# Patient Record
Sex: Male | Born: 1965 | Race: Black or African American | Hispanic: No | Marital: Married | Smoking: Former smoker
Health system: Southern US, Community
[De-identification: ages and names within clinical notes are randomized; demographics above are authoritative.]

## PROBLEM LIST (undated history)

## (undated) DIAGNOSIS — F1910 Other psychoactive substance abuse, uncomplicated: Secondary | ICD-10-CM

## (undated) DIAGNOSIS — F329 Major depressive disorder, single episode, unspecified: Secondary | ICD-10-CM

## (undated) DIAGNOSIS — J209 Acute bronchitis, unspecified: Secondary | ICD-10-CM

## (undated) DIAGNOSIS — F32A Depression, unspecified: Secondary | ICD-10-CM

## (undated) HISTORY — DX: Other psychoactive substance abuse, uncomplicated: F19.10

## (undated) HISTORY — DX: Depression, unspecified: F32.A

## (undated) HISTORY — DX: Acute bronchitis, unspecified: J20.9

## (undated) HISTORY — PX: TOOTH EXTRACTION: SUR596

## (undated) HISTORY — PX: CYST EXCISION: SHX5701

## (undated) HISTORY — DX: Major depressive disorder, single episode, unspecified: F32.9

---

## 1988-07-09 DIAGNOSIS — F1910 Other psychoactive substance abuse, uncomplicated: Secondary | ICD-10-CM

## 1988-07-09 HISTORY — DX: Other psychoactive substance abuse, uncomplicated: F19.10

## 2012-07-09 HISTORY — PX: MOUTH SURGERY: SHX715

## 2014-04-07 ENCOUNTER — Ambulatory Visit (INDEPENDENT_AMBULATORY_CARE_PROVIDER_SITE_OTHER): Payer: BC Managed Care – PPO | Admitting: Family Medicine

## 2014-04-07 ENCOUNTER — Encounter: Payer: Self-pay | Admitting: Family Medicine

## 2014-04-07 VITALS — BP 114/76 | HR 67 | Temp 98.3°F | Ht 68.0 in | Wt 184.5 lb

## 2014-04-07 DIAGNOSIS — F419 Anxiety disorder, unspecified: Principal | ICD-10-CM

## 2014-04-07 DIAGNOSIS — F341 Dysthymic disorder: Secondary | ICD-10-CM

## 2014-04-07 DIAGNOSIS — F32A Depression, unspecified: Secondary | ICD-10-CM

## 2014-04-07 DIAGNOSIS — F329 Major depressive disorder, single episode, unspecified: Secondary | ICD-10-CM | POA: Insufficient documentation

## 2014-04-07 MED ORDER — CITALOPRAM HYDROBROMIDE 20 MG PO TABS: 20.0000 mg | ORAL_TABLET | Freq: Every day | ORAL | Status: DC

## 2014-04-07 NOTE — Progress Notes (Signed)
Pre visit review using our clinic review tool, if applicable. No additional management support is needed unless otherwise documented below in the visit note. 

## 2014-04-07 NOTE — Assessment & Plan Note (Signed)
>  30 minutes spent in face to face time with patient, >50% spent in counselling or coordination of care. I agrees to psychotherapy- referral placed. Also he wants to try a different antidepressant- I did suggest trying to take zoloft at night first.  Ran out of zoloft weeks ago and feels much less tired so he does not want to restart it. eRx sent for celexa- discussed possible interaction with trazodone- he will try benadryl/melatonin for insomnia. Follow up in 1 month. Advised to go to ER for any suicidal or homicidal thoughts. The patient indicates understanding of these issues and agrees with the plan.

## 2014-04-07 NOTE — Patient Instructions (Signed)
It was nice to meet you. We are starting citalopram 20 mg daily. We are also referring a therapist- please stop by to see Shirlee LimerickMarion on your way out to set this up.  Try taking benadryl and or melatonin, and use the trazodone less.  Come see me in 1 month for a complete physical.

## 2014-04-07 NOTE — Progress Notes (Signed)
Subjective:   Patient ID: John Walls, male    DOB: 05/19/1966, 48 y.o.   MRN: [id number]  John Walls is a pleasant 48 y.o. 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  Very stressful job.  On the phone all day- recently started to work from home which in a way is worsening his stress- "its always there." Past year, feeling anxious constantly especially right before he starts working. +anhedonia + insomnia- was taking trazodone, trying not to take this often Has had suicidal thoughts in past- none currently +depression- feels sad, "trapped" in his job Wife is supportive.  Has 3 kids at home too.  Previous doctor stated him on zoloft in 02/2014.  Felt this improved his symptoms but made him too sleepy.  Takes it every am.  No sexual side effects.  Strong psychiatric family history- two siblings with schizophrenia He has never had any auditory of visual hallucinations  Former h/o cocaine use- went to rehab in 1990 and has not used since. No current outpatient prescriptions on file prior to visit.   No current facility-administered medications on file prior to visit.    No Known Allergies  Past Medical History  Diagnosis Date  . 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 Depression   . Bronchitis, acute     Past Surgical History  Procedure Laterality Date  . Mouth surgery  2014  . Tooth extraction      Family History  Problem Relation Age of Onset  . Arthritis Mother   . Heart disease Father   . Stroke Father   . Hypertension Father   . Alcohol abuse Sister   . Drug abuse Sister   . Alcohol abuse Brother   . Drug abuse Brother   . Mental illness Brother     History   Social History  . Marital Status: Married    Spouse Name: N/A    Number of Children: N/A  . Years of Education: N/A   Occupational History  . Not on file.    Social History Main Topics  . Smoking status: Former Games developermoker  . Smokeless tobacco: Not on file  . Alcohol Use: Yes  . Drug Use: No  . Sexual Activity: Yes   Other Topics Concern  . Not on file   Social History Narrative  . No narrative on file   The PMH, PSH, Social History, Family History, Medications, and allergies have [facility], and have been updated if relevant.   Review of Systems    See HPI Denies mania Denies current SI or HI  Objective:    BP 114/76  Pulse 67  Temp(Src) 98.3 F (36.8 C) (Oral)  Ht 5\' 8"  (1.727 m)  Wt 184 lb 8 oz (83.689 kg)  BMI 28.06 kg/m2  SpO2 98%   Physical Exam  Constitutional: He is oriented to person, place, and time. He appears well-developed and well-nourished.  HENT:  Head: Normocephalic.  Neurological: He is alert and oriented to person, place, and time. No cranial nerve deficit.  Skin: Skin is warm and dry.  Psychiatric: He has a normal mood and affect. His behavior is normal. Judgment and thought content normal.          Assessment & Plan:   Anxiety and depression - Plan: Ambulatory referral to Psychology No Follow-up on file.

## 2014-05-18 ENCOUNTER — Ambulatory Visit: Payer: BC Managed Care – PPO | Admitting: Psychology

## 2015-07-10 HISTORY — PX: ELBOW SURGERY: SHX618

## 2019-04-16 ENCOUNTER — Telehealth: Payer: Self-pay | Admitting: General Practice

## 2019-04-16 NOTE — Telephone Encounter (Signed)
Patient called today to schedule new patient appointment He is wanting a full exam and would like a prostate exam. Patient stated he would really prefer a male provider if possible and would like it on a Friday possibly this month if not in November   Would either of you be willing to take this patient?    Thanks!

## 2019-04-17 NOTE — Telephone Encounter (Signed)
Lm for patient to call back and schedule appt.

## 2019-04-17 NOTE — Telephone Encounter (Signed)
Ok to schedule at open 30 min slot. Thanks.

## 2019-04-17 NOTE — Telephone Encounter (Signed)
I think I am closed to new patients at this point.  Either way I do not think I be able to get him in for a physical in the next 2 months, given the current schedule.

## 2019-05-01 ENCOUNTER — Other Ambulatory Visit: Payer: Self-pay

## 2019-05-01 ENCOUNTER — Ambulatory Visit (INDEPENDENT_AMBULATORY_CARE_PROVIDER_SITE_OTHER): Payer: BC Managed Care – PPO | Admitting: Family Medicine

## 2019-05-01 ENCOUNTER — Encounter: Payer: Self-pay | Admitting: Family Medicine

## 2019-05-01 VITALS — BP 136/84 | HR 76 | Temp 98.8°F | Ht 68.5 in | Wt 200.4 lb

## 2019-05-01 DIAGNOSIS — Z13220 Encounter for screening for lipoid disorders: Secondary | ICD-10-CM | POA: Diagnosis not present

## 2019-05-01 DIAGNOSIS — Z1211 Encounter for screening for malignant neoplasm of colon: Secondary | ICD-10-CM

## 2019-05-01 DIAGNOSIS — Z131 Encounter for screening for diabetes mellitus: Secondary | ICD-10-CM

## 2019-05-01 DIAGNOSIS — Z125 Encounter for screening for malignant neoplasm of prostate: Secondary | ICD-10-CM | POA: Diagnosis not present

## 2019-05-01 DIAGNOSIS — Z0000 Encounter for general adult medical examination without abnormal findings: Secondary | ICD-10-CM | POA: Diagnosis not present

## 2019-05-01 LAB — LIPID PANEL
Cholesterol: 241 mg/dL — ABNORMAL HIGH (ref 0–200)
HDL: 45.1 mg/dL (ref >39.00)
LDL Cholesterol: 162 mg/dL — ABNORMAL HIGH (ref 0–99)
NonHDL: 195.65
Total CHOL/HDL Ratio: 5
Triglycerides: 169 mg/dL — ABNORMAL HIGH (ref 0.0–149.0)
VLDL: 33.8 mg/dL (ref 0.0–40.0)

## 2019-05-01 LAB — BASIC METABOLIC PANEL
BUN: 17 mg/dL (ref 6–23)
CO2: 30 mEq/L (ref 19–32)
Calcium: 9.3 mg/dL (ref 8.4–10.5)
Chloride: 102 mEq/L (ref 96–112)
Creatinine, Ser: 1.14 mg/dL (ref 0.40–1.50)
GFR: 81.4 mL/min (ref >60.00)
Glucose, Bld: 99 mg/dL (ref 70–99)
Potassium: 4.6 mEq/L (ref 3.5–5.1)
Sodium: 139 mEq/L (ref 135–145)

## 2019-05-01 LAB — PSA: PSA: 1.34 ng/mL (ref 0.10–4.00)

## 2019-05-01 NOTE — Assessment & Plan Note (Signed)
Preventative protocols reviewed and updated unless pt declined. Discussed healthy diet and lifestyle.  

## 2019-05-01 NOTE — Progress Notes (Signed)
This visit was conducted in person.  BP 136/84 (BP Location: Left Arm, Patient Position: Sitting, Cuff Size: Normal)   Pulse 76   Temp 98.8 F (37.1 C) (Temporal)   Ht 5' 8.5" (1.74 m)   Wt 200 lb 6 oz (90.9 kg)   SpO2 97%   BMI 30.02 kg/m    CC: new pt to establish care Subjective:    Patient ID: John Walls, male    DOB: [birth year], 53 y.o.   MRN: [id number]  HPI: John Walls is a 53 y.o. male presenting on 05/01/2019 for New Patient (Initial Visit)   Goes by John Walls Dr Deborra Medina 2015. Not seen since. Saw Dr Concha Pyo at [facility] in interim.   Last CPE ~2016.   Preventative: Colon cancer screening - discussed, would like stool kit.  Prostate cancer screening - discussed, would like PSA to start.  Flu shot - declines Tetanus with prior doc Seat belt use discussed Sunscreen use discussed. No changing moles on skin.  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 Interim medical history since our last visit reviewed. Allergies and medications reviewed and updated. Outpatient Medications Prior to Visit  Medication Sig Dispense Refill  . citalopram (CELEXA) 20 MG tablet Take 1 tablet (20 mg total) by mouth daily. 30 tablet 3  . traZODone (DESYREL) 50 MG tablet Take 50 mg by mouth at bedtime.     No facility-administered medications prior to visit.      Per HPI unless specifically indicated in ROS section below Review of Systems  Constitutional: Negative for activity change, appetite change, chills, fatigue, fever and unexpected weight change.  HENT: Negative for hearing loss.   Eyes: Negative for visual disturbance.  Respiratory: Negative for cough, chest  tightness, shortness of breath and wheezing.   Cardiovascular: Negative for chest pain, palpitations and leg swelling.  Gastrointestinal: Negative for abdominal distention, abdominal pain, blood in stool, constipation, diarrhea, nausea and vomiting.  Genitourinary: Negative for difficulty urinating and hematuria.  Musculoskeletal: Negative for arthralgias, myalgias and neck pain.  Skin: Negative for rash.  Neurological: Negative for dizziness, seizures, syncope and headaches.  Hematological: Negative for adenopathy. Does not bruise/bleed easily.  Psychiatric/Behavioral: Negative for dysphoric mood. The patient is not nervous/anxious.    Objective:    BP 136/84 (BP Location: Left Arm, Patient Position: Sitting, Cuff Size: Normal)   Pulse 76   Temp 98.8 F (37.1 C) (Temporal)   Ht 5' 8.5" (1.74 m)   Wt 200 lb 6 oz (90.9 kg)   SpO2 97%   BMI 30.02 kg/m   Wt Readings from Last 3 Encounters:  05/01/19 200 lb 6 oz (90.9 kg)  04/07/14 184 lb 8 oz (83.7 kg)    Physical Exam Vitals signs and nursing note reviewed.  Constitutional:      General: He is not in acute distress.    Appearance: Normal appearance. He is well-developed. He is not ill-appearing.  HENT:     Head: Normocephalic and atraumatic.     Right Ear: Hearing, tympanic membrane, ear canal and external ear normal.     Left Ear: Hearing, tympanic membrane, ear canal and external ear normal.  Nose: Nose normal.     Mouth/Throat:     Mouth: Mucous membranes are moist.     Pharynx: Oropharynx is clear. Uvula midline. No posterior oropharyngeal erythema.  Eyes:     General: No scleral icterus.    Conjunctiva/sclera: Conjunctivae normal.     Pupils: Pupils are equal, round, and reactive to light.  Neck:     Musculoskeletal: Normal range of motion and neck supple.  Cardiovascular:     Rate and Rhythm: Normal rate and regular rhythm.     Pulses: Normal pulses.          Radial pulses are 2+ on the right side and 2+ on the  left side.     Heart sounds: Normal heart sounds. No murmur.  Pulmonary:     Effort: Pulmonary effort is normal. No respiratory distress.     Breath sounds: Normal breath sounds. No wheezing, rhonchi or rales.  Abdominal:     General: Abdomen is flat. Bowel sounds are normal. There is no distension.     Palpations: Abdomen is soft. There is no mass.     Tenderness: There is no abdominal tenderness. There is no guarding or rebound.     Hernia: No hernia is present.  Musculoskeletal: Normal range of motion.     Right lower leg: No edema.     Left lower leg: No edema.  Lymphadenopathy:     Cervical: No cervical adenopathy.  Skin:    General: Skin is warm and dry.     Capillary Refill: Capillary refill takes less than 2 seconds.     Findings: No rash.  Neurological:     General: No focal deficit present.     Mental Status: He is alert and oriented to person, place, and time.     Comments: CN grossly intact, station and gait intact  Psychiatric:        Mood and Affect: Mood normal.        Behavior: Behavior normal.        Thought Content: Thought content normal.        Judgment: Judgment normal.       No results found for this or any previous visit. Assessment & Plan:   Problem List Items Addressed This Visit    Health maintenance examination - Primary    Preventative protocols reviewed and updated unless pt declined. Discussed healthy diet and lifestyle.        Other Visit Diagnoses    Special screening for malignant neoplasm of prostate       Relevant Orders   PSA   Special screening for malignant neoplasms, colon       Relevant Orders   Fecal occult blood, imunochemical   Lipid screening       Relevant Orders   Lipid panel   Diabetes mellitus screening       Relevant Orders   Basic metabolic panel       No orders of the defined types were placed in this encounter.  Orders Placed This Encounter  Procedures  . Fecal occult blood, imunochemical    Standing  Status:   Future    Standing Expiration Date:   04/30/2020  . Lipid panel  . PSA  . Basic metabolic panel    Patient instructions: Pass by lab for stool kit.  Lab work today.  You are doing well today Return as needed or in 1 year for next physical.  Follow up plan: Return in about 1 year (  around 04/30/2020) for annual exam, prior fasting for blood work.  [name], MD

## 2019-05-01 NOTE — Patient Instructions (Addendum)
Pass by lab for stool kit.  Lab work today.  You are doing well today Return as needed or in 1 year for next physical.  Health Maintenance, Male Adopting a healthy lifestyle and getting preventive care are important in promoting health and wellness. Ask your health care provider about:  The right schedule for you to have regular tests and exams.  Things you can do on your own to prevent diseases and keep yourself healthy. What should I know about diet, weight, and exercise? Eat a healthy diet   Eat a diet that includes plenty of vegetables, fruits, low-fat dairy products, and lean protein.  Do not eat a lot of foods that are high in solid fats, added sugars, or sodium. Maintain a healthy weight Body mass index (BMI) is a measurement that can be used to identify possible weight problems. It estimates body fat based on height and weight. Your health care provider can help determine your BMI and help you achieve or maintain a healthy weight. Get regular exercise Get regular exercise. This is one of the most important things you can do for your health. Most adults should:  Exercise for at least 150 minutes each week. The exercise should increase your heart rate and make you sweat (moderate-intensity exercise).  Do strengthening exercises at least twice a week. This is in addition to the moderate-intensity exercise.  Spend less time sitting. Even light physical activity can be beneficial. Watch cholesterol and blood lipids Have your blood tested for lipids and cholesterol at 52 years of age, then have this test every 5 years. You may need to have your cholesterol levels checked more often if:  Your lipid or cholesterol levels are high.  You are older than 52 years of age.  You are at high risk for heart disease. What should I know about cancer screening? Many types of cancers can be detected early and may often be prevented. Depending on your health history and family history, you  may need to have cancer screening at various ages. This may include screening for:  Colorectal cancer.  Prostate cancer.  Skin cancer.  Lung cancer. What should I know about heart disease, diabetes, and high blood pressure? Blood pressure and heart disease  High blood pressure causes heart disease and increases the risk of stroke. This is more likely to develop in people who have high blood pressure readings, are of African descent, or are overweight.  Talk with your health care provider about your target blood pressure readings.  Have your blood pressure checked: ? Every 3-5 years if you are 52-36 years of age. ? Every year if you are 52 years old or older.  If you are between the ages of 31 and 44 and are a current or former smoker, ask your health care provider if you should have a one-time screening for abdominal aortic aneurysm (AAA). Diabetes Have regular diabetes screenings. This checks your fasting blood sugar level. Have the screening done:  Once every three years after age 52 if you are at a normal weight and have a low risk for diabetes.  More often and at a younger age if you are overweight or have a high risk for diabetes. What should I know about preventing infection? Hepatitis B If you have a higher risk for hepatitis B, you should be screened for this virus. Talk with your health care provider to find out if you are at risk for hepatitis B infection. Hepatitis C Blood testing is recommended for:  Everyone born from [birth year] through 1964.  Anyone with known risk factors for hepatitis C. Sexually transmitted infections (STIs)  You should be screened each year for STIs, including gonorrhea and chlamydia, if: ? You are sexually active and are younger than 52 years of age. ? You are older than 52 years of age and your health care provider tells you that you are at risk for this type of infection. ? Your sexual activity has changed since you were last screened, and you  are at increased risk for chlamydia or gonorrhea. Ask your health care provider if you are at risk.  Ask your health care provider about whether you are at high risk for HIV. Your health care provider may recommend a prescription medicine to help prevent HIV infection. If you choose to take medicine to prevent HIV, you should first get tested for HIV. You should then be tested every 3 months for as long as you are taking the medicine. Follow these instructions at home: Lifestyle  Do not use any products that contain nicotine or tobacco, such as cigarettes, e-cigarettes, and chewing tobacco. If you need help quitting, ask your health care provider.  Do not use street drugs.  Do not share needles.  Ask your health care provider for help if you need support or information about quitting drugs. Alcohol use  Do not drink alcohol if your health care provider tells you not to drink.  If you drink alcohol: ? Limit how much you have to 0-2 drinks a day. ? Be aware of how much alcohol is in your drink. In the [place] one drink equals one 12 oz bottle of beer (355 mL), one 5 oz glass of wine (148 mL), or one 1 oz glass of hard liquor (44 mL). General instructions  Schedule regular health, dental, and eye exams.  Stay current with your vaccines.  Tell your health care provider if: ? You often feel depressed. ? You have ever been abused or do not feel safe at home. Summary  Adopting a healthy lifestyle and getting preventive care are important in promoting health and wellness.  Follow your health care provider's instructions about healthy diet, exercising, and getting tested or screened for diseases.  Follow your health care provider's instructions on monitoring your cholesterol and blood pressure. This information is not intended to replace advice given to you by your health care provider. Make sure you discuss any questions you have with your health care provider. Document Released:  12/22/2007 Document Revised: 06/18/2018 Document Reviewed: 06/18/2018 Elsevier Patient Education  2020 [employer].

## 2019-05-02 ENCOUNTER — Encounter: Payer: Self-pay | Admitting: Family Medicine

## 2019-05-02 DIAGNOSIS — E785 Hyperlipidemia, unspecified: Secondary | ICD-10-CM | POA: Insufficient documentation

## 2019-05-11 ENCOUNTER — Other Ambulatory Visit (INDEPENDENT_AMBULATORY_CARE_PROVIDER_SITE_OTHER): Payer: BC Managed Care – PPO

## 2019-05-11 DIAGNOSIS — Z1211 Encounter for screening for malignant neoplasm of colon: Secondary | ICD-10-CM

## 2019-05-11 LAB — FECAL OCCULT BLOOD, IMMUNOCHEMICAL: Fecal Occult Bld: NEGATIVE

## 2019-05-11 LAB — FECAL OCCULT BLOOD, GUAIAC: Fecal Occult Blood: NEGATIVE

## 2019-05-13 ENCOUNTER — Encounter: Payer: Self-pay | Admitting: Family Medicine

## 2019-06-06 ENCOUNTER — Encounter: Payer: Self-pay | Admitting: Family Medicine

## 2019-07-07 ENCOUNTER — Encounter: Payer: Self-pay | Admitting: Family Medicine

## 2019-07-07 ENCOUNTER — Ambulatory Visit (INDEPENDENT_AMBULATORY_CARE_PROVIDER_SITE_OTHER)
Admission: RE | Admit: 2019-07-07 | Discharge: 2019-07-07 | Disposition: A | Discharge status: Home / Self Care | Payer: BC Managed Care – PPO | Source: Ambulatory Visit | Attending: Family Medicine | Admitting: Family Medicine

## 2019-07-07 ENCOUNTER — Other Ambulatory Visit: Payer: Self-pay

## 2019-07-07 ENCOUNTER — Ambulatory Visit (INDEPENDENT_AMBULATORY_CARE_PROVIDER_SITE_OTHER): Payer: BC Managed Care – PPO | Admitting: Family Medicine

## 2019-07-07 VITALS — BP 134/80 | HR 85 | Temp 98.0°F | Ht 68.5 in | Wt 199.2 lb

## 2019-07-07 DIAGNOSIS — G8929 Other chronic pain: Secondary | ICD-10-CM

## 2019-07-07 DIAGNOSIS — M25521 Pain in right elbow: Secondary | ICD-10-CM

## 2019-07-07 DIAGNOSIS — M19021 Primary osteoarthritis, right elbow: Secondary | ICD-10-CM | POA: Diagnosis not present

## 2019-07-07 MED ORDER — DICLOFENAC SODIUM 1 % EX GEL: 2.0000 g | Freq: Three times a day (TID) | CUTANEOUS | 1 refills | Status: AC

## 2019-07-07 NOTE — Patient Instructions (Addendum)
I think you have residual irritation of elbow after prior surgery.  Xray today. Try voltaren gel three times daily for the next 2-3 weeks If no better, return to see Dr Lorelei Pont for evaluation.  Continue compression.

## 2019-07-07 NOTE — Assessment & Plan Note (Addendum)
In setting of what sounds like prior olecranon fracture s/p elbow surgery 2017. Had done well until earlier this year. Now with recurrent diffuse elbow ache that increases with use of elbow. Not consistent with epicondylitis or neuritis. Check elbow films to eval osteoarthritis load. rec volatern gel TID PRN as well as discussed continued ibuprofen use. If not improving, recommend f/u with sports med ([facility]) or ortho who did his surgery. Pt agrees with plan.

## 2019-07-07 NOTE — Progress Notes (Signed)
This visit was conducted in person.  BP 134/80 (BP Location: Left Arm, Patient Position: Sitting, Cuff Size: Large)   Pulse 85   Temp 98 F (36.7 C) (Temporal)   Ht 5' 8.5" (1.74 m)   Wt 199 lb 3 oz (90.4 kg)   SpO2 97%   BMI 29.85 kg/m    CC: R arm pain Subjective:    Patient ID: John Walls, male    DOB: February 15, 1966, 53 y.o.   MRN: [id number]  HPI: John Walls is a 53 y.o. male presenting on 07/07/2019 for Arm Pain (C/o right arm pain from bicep to forearm.  Started 4-5 mos ago. Denies injury.  H/o elbow surgery 2-3 yrs ago.  Tried heat/cold therapy, rest and exercise. )   R handed   4-5 mo h/o R arm pain from forearm into elbow and upper arm. Denies inciting trauma/injury or falls. Worse with use of R arm, ie brushing teeth or doing his hair. May have overexerted forearm last year.   Denies weakness of arm, tingling or numbness of hand.  Treating with heating pad, ice, ibuprofen 400mg  tid, bengay, ace bandage, copper fit elbow sleeve.   H/o R elbow surgery 2017 for fractured elbow (cracked elbow while doing pushups). This was done in [place].      Relevant past medical, surgical, family and social history reviewed and updated as indicated. Interim medical history since our last visit reviewed. Allergies and medications reviewed and updated. No outpatient medications prior to visit.   No facility-administered medications prior to visit.     Per HPI unless specifically indicated in ROS section below Review of Systems Objective:    BP 134/80 (BP Location: Left Arm, Patient Position: Sitting, Cuff Size: Large)   Pulse 85   Temp 98 F (36.7 C) (Temporal)   Ht 5' 8.5" (1.74 m)   Wt 199 lb 3 oz (90.4 kg)   SpO2 97%   BMI 29.85 kg/m   Wt Readings from Last 3 Encounters:  07/07/19 199 lb 3 oz (90.4 kg)  05/01/19 200 lb 6 oz (90.9 kg)  04/07/14 184 lb 8 oz (83.7 kg)    Physical Exam Vitals and nursing note reviewed.  Constitutional:      General: He is not  in acute distress.    Appearance: Normal appearance. He is not ill-appearing.  Musculoskeletal:        General: Tenderness present. No swelling or signs of injury. Normal range of motion.     Comments: 2+ rad pulses bilaterally. FROM at bilateral elbows. R arm: no pain with forced wrist flexion/extension against resistance. Diffuse discomfort to palpation along R lateral forearm and posterior elbow including olecranon. No pain at lateral or medial epicondyle.   Skin:    General: Skin is warm and dry.     Findings: No rash.  Neurological:     Mental Status: He is alert.  Psychiatric:        Mood and Affect: Mood normal.        Behavior: Behavior normal.       Results for orders placed or performed in visit on 05/13/19  Fecal Occult Blood, Guaiac  Result Value Ref Range   Fecal Occult Blood Negative    Assessment & Plan:  This visit occurred during the SARS-CoV-2 public health emergency.  Safety protocols were in place, including screening questions prior to the visit, additional usage of staff PPE, and extensive cleaning of exam room while observing appropriate contact time as indicated for  disinfecting solutions.   Problem List Items Addressed This Visit    Chronic elbow pain, right - Primary    In setting of what sounds like prior olecranon fracture s/p elbow surgery 2017. Had done well until earlier this year. Now with recurrent diffuse elbow ache that increases with use of elbow. Not consistent with epicondylitis or neuritis. Check elbow films to eval osteoarthritis load. rec volatern gel TID PRN as well as discussed continued ibuprofen use. If not improving, recommend f/u with sports med ([facility]) or ortho who did his surgery. Pt agrees with plan.       Relevant Orders   DG Elbow Complete Right       Meds ordered this encounter  Medications  . diclofenac Sodium (VOLTAREN) 1 % GEL    Sig: Apply 2 g topically 3 (three) times daily.    Dispense:  100 g    Refill:  1   Orders  Placed This Encounter  Procedures  . DG Elbow Complete Right    Standing Status:   Future    Number of Occurrences:   1    Standing Expiration Date:   09/05/2020    Order Specific Question:   Reason for Exam (SYMPTOM  OR DIAGNOSIS REQUIRED)    Answer:   R elbow pain in h/o fracture and surgery    Order Specific Question:   Preferred imaging location?    Answer:   [facility]    Order Specific Question:   Release to patient    Answer:   Immediate    Order Specific Question:   Radiology Contrast Protocol - do NOT remove file path    Answer:   [email or web address]    Patient Instructions  I think you have residual irritation of elbow after prior surgery.  Xray today. Try voltaren gel three times daily for the next 2-3 weeks If no better, return to see Dr Lorelei Pont for evaluation.  Continue compression.    Follow up plan: Return if symptoms worsen or fail to improve.  John Bush, MD

## 2019-07-08 ENCOUNTER — Other Ambulatory Visit: Payer: Self-pay | Admitting: Family Medicine

## 2019-07-08 DIAGNOSIS — G8929 Other chronic pain: Secondary | ICD-10-CM

## 2019-07-08 DIAGNOSIS — M25521 Pain in right elbow: Secondary | ICD-10-CM

## 2019-07-14 ENCOUNTER — Telehealth: Payer: Self-pay | Admitting: Family Medicine

## 2019-07-14 NOTE — Telephone Encounter (Signed)
Patient called back in regards to his referral  He is requesting a call back  Looked at the previous message, he said he thought the dr was within [facility] and started with a "C"

## 2019-09-28 ENCOUNTER — Telehealth: Payer: Self-pay

## 2019-09-28 NOTE — Telephone Encounter (Signed)
Orthopedic Surgery Referral, 07-08-2019, closed.  See Referral Notes/hx.  Pt has not responded to phone calls or Unable to Contact letter.

## 2019-09-29 NOTE — Telephone Encounter (Signed)
Noted  

## 2020-03-09 IMAGING — DX DG ELBOW COMPLETE 3+V*R*
4 series · 4 of 4 positions shown · non-contrast
Comparison: None.

CLINICAL DATA: Right elbow pain. Remote history of fracture and
surgery. No recent injury.

EXAM:
RIGHT ELBOW - COMPLETE 3+ VIEW

[elbow ap]
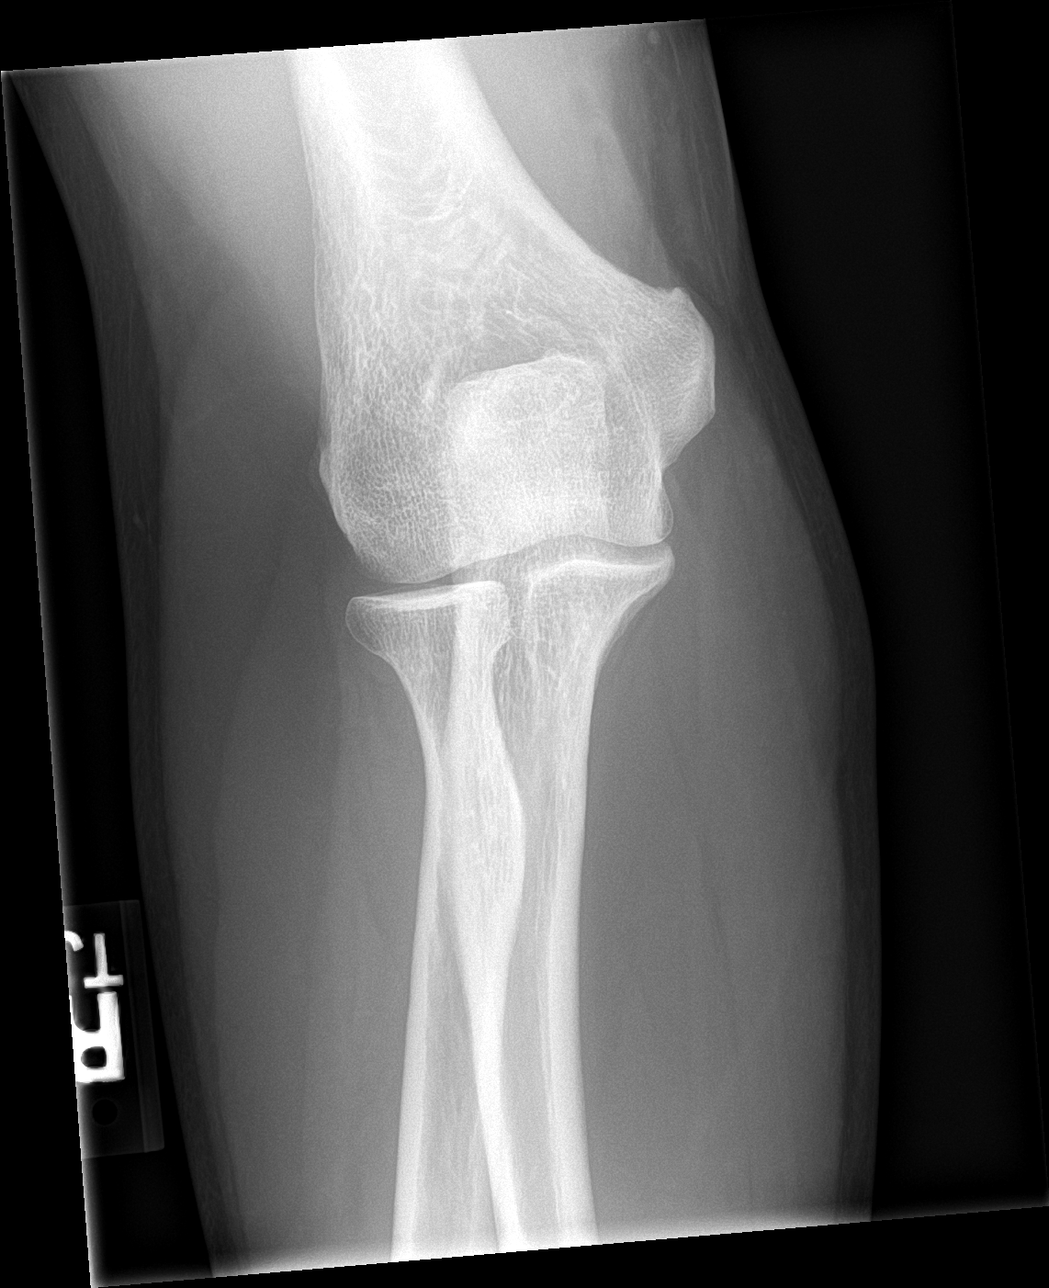

[elbow obl (1 of 2)]
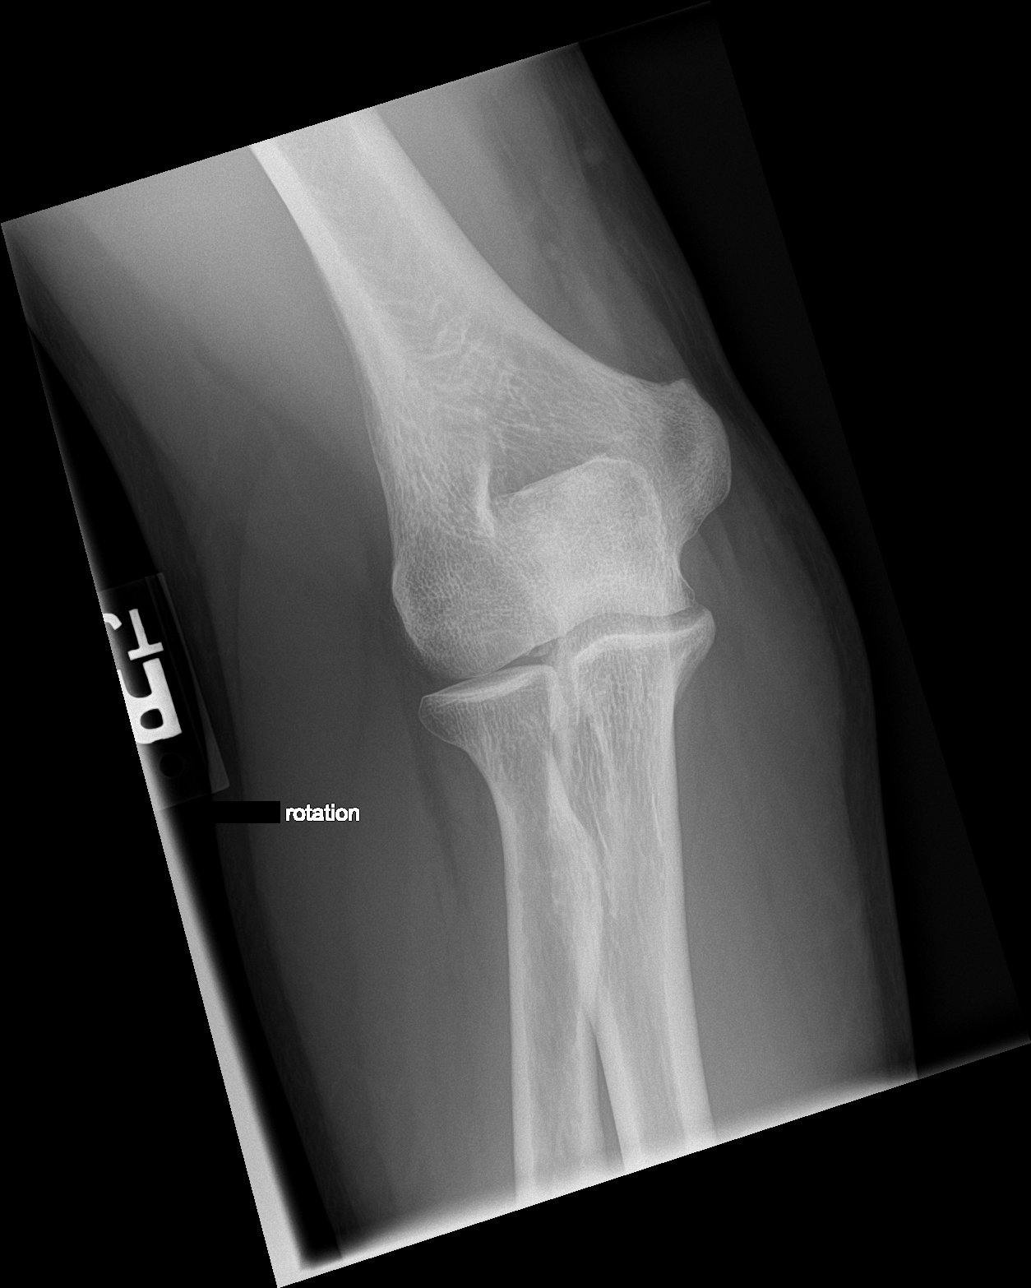

[elbow lat]
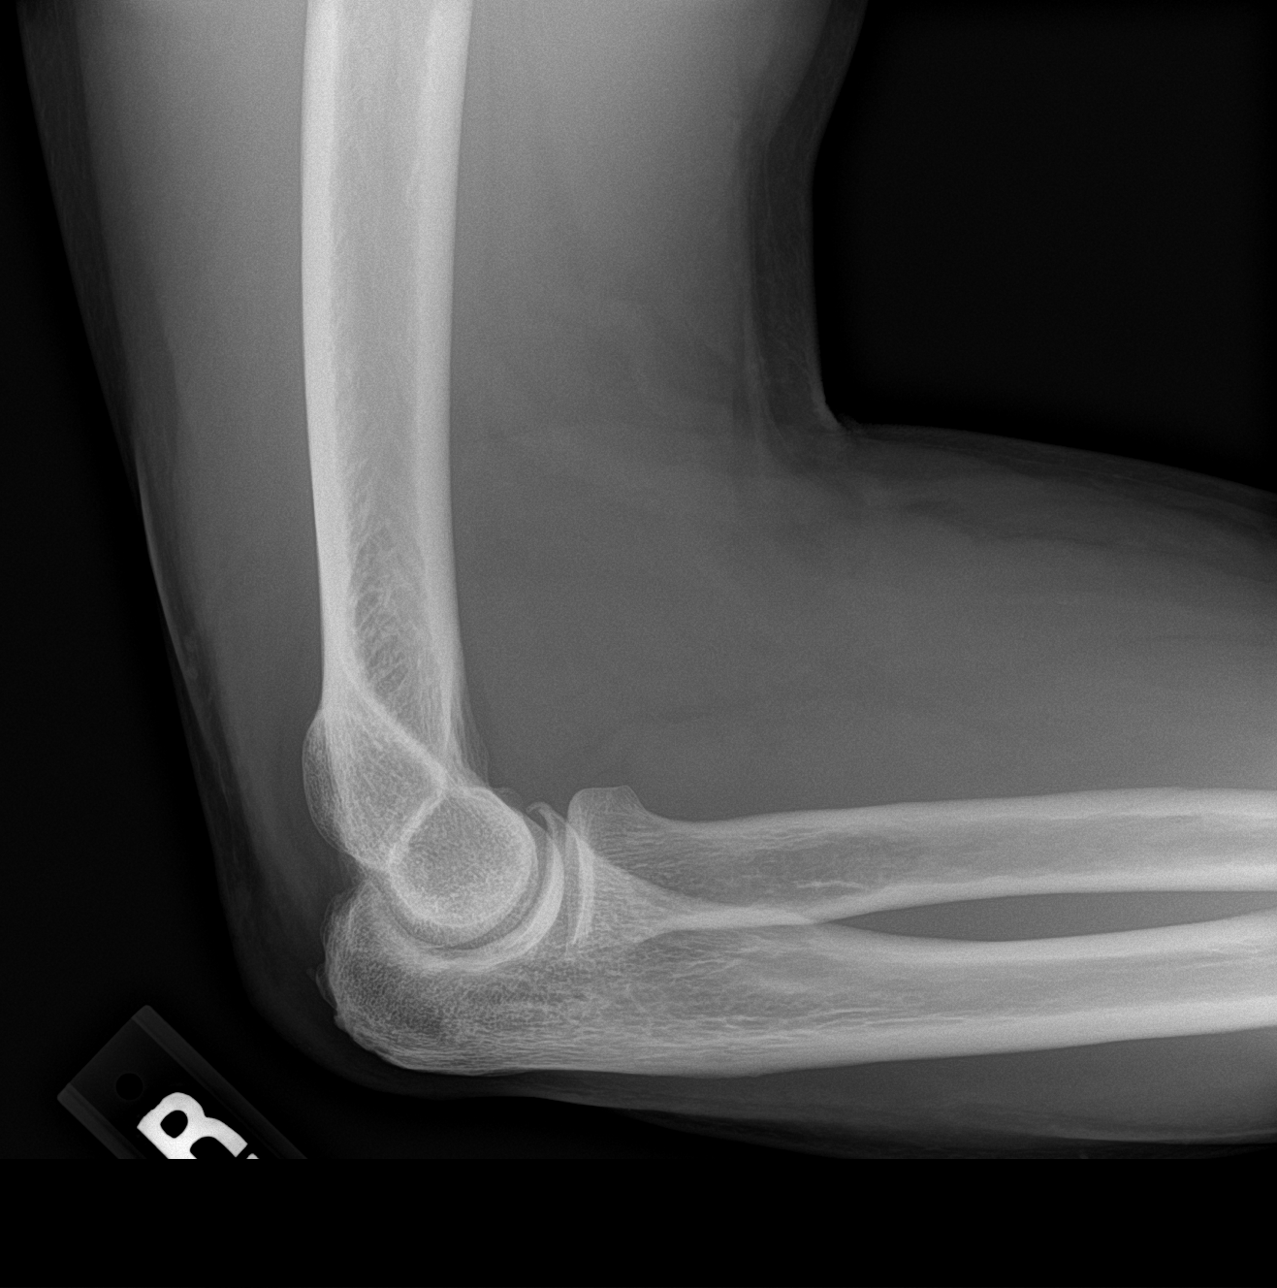

[elbow obl (2 of 2)]
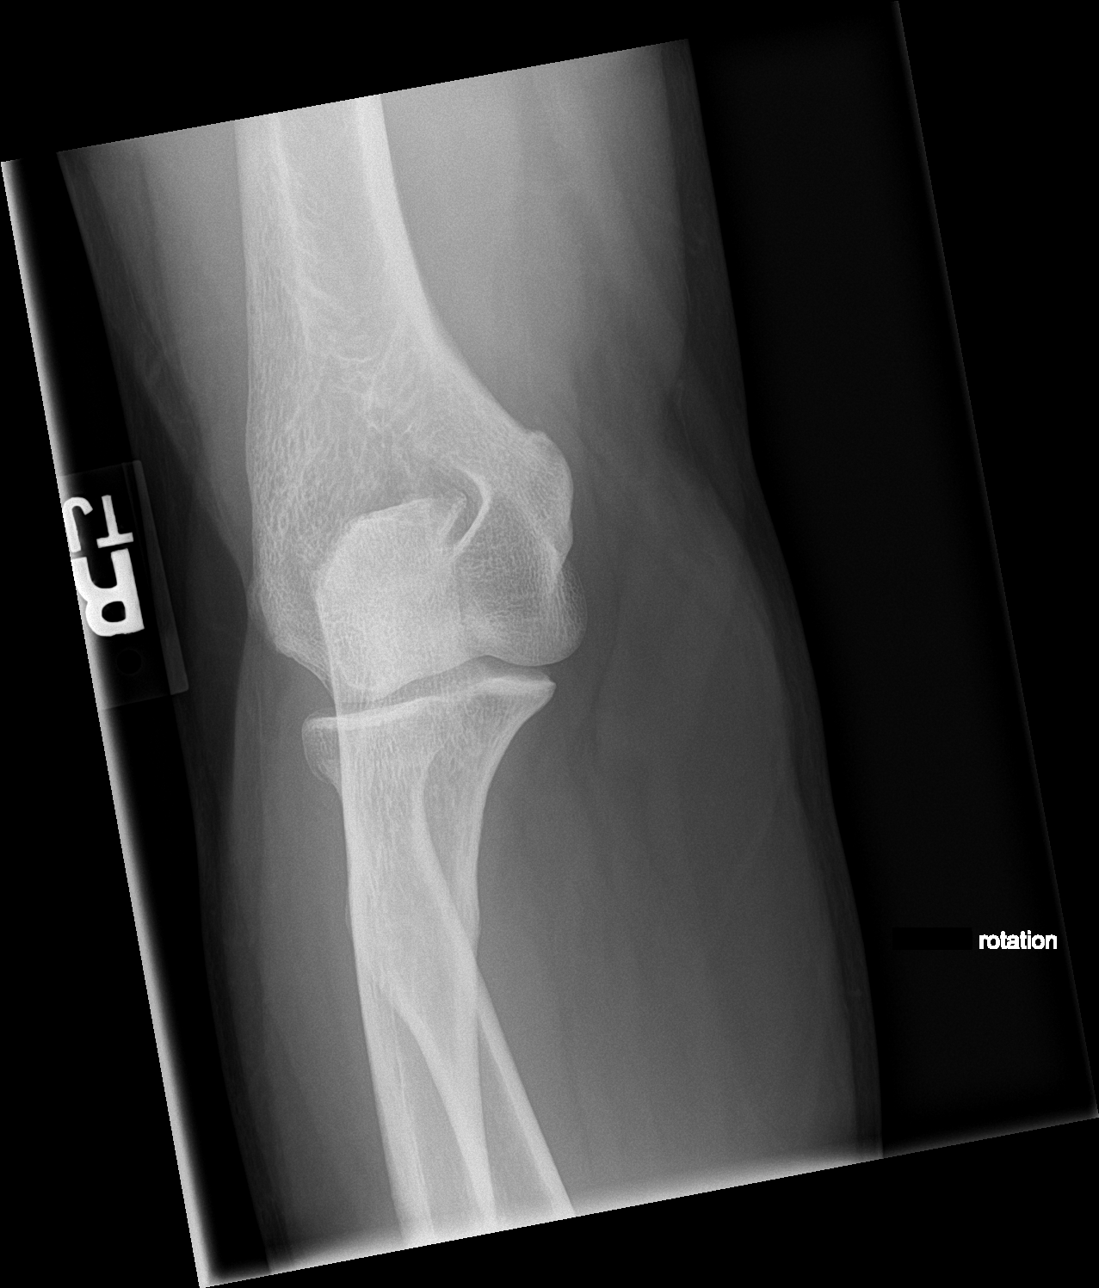

[4 of 4 positions shown; findings below may reference images not displayed]

FINDINGS: There is no acute bony or joint abnormality. No joint effusion. Mild
osteophytosis off the coronoid process is identified. A 0.3 cm in
diameter ossific fragment projecting between the radial and ulna may
be a loose body in the joint.
IMPRESSION: No acute abnormality.

Mild degenerative disease with possible 0.3 cm loose body in the
joint.

## 2020-05-06 ENCOUNTER — Encounter: Payer: BC Managed Care – PPO | Admitting: Family Medicine

## 2021-10-22 ENCOUNTER — Emergency Department
Admission: EM | Admit: 2021-10-22 | Discharge: 2021-10-22 | Disposition: A | Discharge status: Home / Self Care | Payer: BLUE CROSS/BLUE SHIELD | Attending: Emergency Medicine | Admitting: Emergency Medicine

## 2021-10-22 DIAGNOSIS — S3021XA Contusion of penis, initial encounter: Secondary | ICD-10-CM | POA: Diagnosis not present

## 2021-10-22 DIAGNOSIS — X58XXXA Exposure to other specified factors, initial encounter: Secondary | ICD-10-CM | POA: Diagnosis not present

## 2021-10-22 DIAGNOSIS — S3994XA Unspecified injury of external genitals, initial encounter: Secondary | ICD-10-CM | POA: Diagnosis present

## 2021-10-22 DIAGNOSIS — S39840A Fracture of corpus cavernosum penis, initial encounter: Secondary | ICD-10-CM

## 2021-10-22 LAB — BASIC METABOLIC PANEL
Anion gap: 7 (ref 5–15)
BUN: 28 mg/dL — ABNORMAL HIGH (ref 6–20)
CO2: 25 mmol/L (ref 22–32)
Calcium: 8.7 mg/dL — ABNORMAL LOW (ref 8.9–10.3)
Chloride: 106 mmol/L (ref 98–111)
Creatinine, Ser: 1.36 mg/dL — ABNORMAL HIGH (ref 0.61–1.24)
GFR, Estimated: >60 mL/min (ref >60)
Glucose, Bld: 135 mg/dL — ABNORMAL HIGH (ref 70–99)
Potassium: 4 mmol/L (ref 3.5–5.1)
Sodium: 138 mmol/L (ref 135–145)

## 2021-10-22 LAB — CBC
HCT: 43.6 % (ref 39.0–52.0)
Hemoglobin: 14.2 g/dL (ref 13.0–17.0)
MCH: 31.1 pg (ref 26.0–34.0)
MCHC: 32.6 g/dL (ref 30.0–36.0)
MCV: 95.6 fL (ref 80.0–100.0)
Platelets: 192 10*3/uL (ref 150–400)
RBC: 4.56 MIL/uL (ref 4.22–5.81)
RDW: 12.5 % (ref 11.5–15.5)
WBC: 4.8 10*3/uL (ref 4.0–10.5)
nRBC: 0 % (ref 0.0–0.2)

## 2021-10-22 NOTE — Progress Notes (Addendum)
Brief Urology consult ? ?56 yo M who sustained penile injury with intercourse yesterday with detumescence shaft welling/ hematoma.   He is voiding well.  He is currently not NPO. ? ?History, mechanism, and exam all consistent with penile fracture requiring exploration. ? ?Given that this is not emergent, we will plan for penile exploration and repair tomorrow.  Please make patient NPO at [place].  He can likely be discharged post op.   ? ?After further discussion with Thayer Ohm (provider in the emergency room) John Walls is a reliable patient and not significant pain.  As such, we can see him in clinic first thing in the morning and add him onto the operating room schedule at the end of the day tomorrow.  Were all agreeable to this plan.  Patient understands that failure to undergo repair could lead to longstanding irreversible erectile dysfunction. ? ?Vanna Scotland, MD ? ? ? ? ?

## 2021-10-22 NOTE — ED Provider Notes (Signed)
?[facility] EMER[facility]ENCY [facility]PARTMENT ?Provider Note ? ? ?CSN: [id number] ?Arrival date & time: 10/22/21  1729 ? ?  ? ?History ? ?Chief Complaint  ?Patient presents with  ? Groin Swelling  ? ? ?John Walls is a 56 y.o. male presents to the emergency department for evaluation of penile swelling.  Patient states last night he was having intercourse around 11 PM to midnight when he felt sudden onset of pain and swelling along the penile shaft.  He denies any other trauma or injury.  No testicular pain, abdominal pain.  No pain or difficulty with urination.  He does not report any hematuria.  Pain is present with touch and with movement ? ?Patient with no known past medical history.  Does not see PCP regularly. ? ?HPI ? ?  ? ?Home Medications ?Prior to Admission medications   ?Medication Sig Start Date End Date Taking? Authorizing Provider  ?diclofenac Sodium (VOLTAREN) 1 % GEL Apply 2 g topically 3 (three) times daily. 07/07/19   Eustaquio Boyden, MD  ?   ? ?Allergies    ?Patient has no known allergies.   ? ?Review of Systems   ?Review of Systems ? ?Physical Exam ?Updated Vital Signs ?BP (!) 171/103   Pulse 87   Temp 98.4 ?F (36.9 ?C) (Oral)   Resp 18   Wt 90.7 kg   SpO2 95%   BMI 29.97 kg/m?  ?Physical Exam ?Constitutional:   ?   Appearance: He is well-developed.  ?HENT:  ?   Head: Normocephalic and atraumatic.  ?Eyes:  ?   Conjunctiva/sclera: Conjunctivae normal.  ?Cardiovascular:  ?   Rate and Rhythm: Normal rate.  ?Pulmonary:  ?   Effort: Pulmonary effort is normal. No respiratory distress.  ?Genitourinary: ?   Comments: Examination of the penile shaft shows a large hematoma along the right side of the penile shaft.  Hematoma is tender to palpation.  Hematoma is large, firm, slightly larger than a golf ball.  No penile discharge or drainage.  No testicular tenderness or scrotal swelling.  No skin breakdown noted. ?Musculoskeletal:     ?   General: Normal range of motion.  ?   Cervical  back: Normal range of motion.  ?Skin: ?   General: Skin is warm.  ?   Findings: No rash.  ?Neurological:  ?   Mental Status: He is alert and oriented to person, place, and time.  ?Psychiatric:     ?   Behavior: Behavior normal.     ?   Thought Content: Thought content normal.  ? ? ?ED Results / Procedures / Treatments   ?Labs ?(all labs ordered are listed, but only abnormal results are displayed) ?Labs Reviewed  ?BASIC METABOLIC PANEL - Abnormal; Notable for the following components:  ?    Result Value  ? Glucose, Bld 135 (*)   ? BUN 28 (*)   ? Creatinine, Ser 1.36 (*)   ? Calcium 8.7 (*)   ? All other components within normal limits  ?CBC  ?CBC  ?BASIC METABOLIC PANEL  ? ? ?EKG ?None ? ?Radiology ?No results found. ? ?Procedures ?Procedures  ? ?Medications Ordered in ED ?Medications - No data to display ? ?ED Course/ Medical Decision Making/ A&P ?  ?                        ?Medical Decision Making ?Amount and/or Complexity of Data Reviewed ?Labs: ordered. ? ? ?56 year old male with penile fracture.  Injury occurred last night around 11 PM.  Patient with no urinary symptoms or hematuria.  He has minimal pain, only tender to touch.  Discussed with urology who recommended patient coming to the office tomorrow morning n.p.o. for H&P for probable exploratory surgery tomorrow.  Patient is in agreement.  He understands signs symptoms return to the ER for. ?Final Clinical Impression(s) / ED Diagnoses ?Final diagnoses:  ?Penile fracture, initial encounter  ? ? ?Rx / DC Orders ?ED Discharge Orders   ? ? None  ? ?  ? ? ?  ?Ronnette Juniper ?10/22/21 1917 ? ?  ?Phineas Semen, MD ?10/22/21 1934 ? ?

## 2021-10-22 NOTE — Discharge Instructions (Signed)
Do not eat or drink anything after midnight.  You may use Tylenol for pain.  Show up at Dr. Delana Meyer office at 8:30 in the morning to be seen and evaluated.  Return to the ER for any inability to urinate, severe pain worsening symptoms or to changes in your health. ?

## 2021-10-22 NOTE — ED Triage Notes (Signed)
Pt comes pov with penile swelling after being intimate with his wife last night. States erection went away and it doesn't hurt but is "engorged". No redness. No drainage.  ?

## 2021-10-23 ENCOUNTER — Other Ambulatory Visit: Payer: Self-pay

## 2021-10-23 ENCOUNTER — Ambulatory Visit
Admission: RE | Admit: 2021-10-23 | Discharge: 2021-10-23 | Disposition: A | Discharge status: Home / Self Care | Payer: BLUE CROSS/BLUE SHIELD | Attending: Urology | Admitting: Urology

## 2021-10-23 ENCOUNTER — Ambulatory Visit: Payer: BLUE CROSS/BLUE SHIELD | Admitting: Certified Registered Nurse Anesthetist

## 2021-10-23 ENCOUNTER — Encounter: Payer: Self-pay | Admitting: Urology

## 2021-10-23 ENCOUNTER — Ambulatory Visit: Payer: BLUE CROSS/BLUE SHIELD | Admitting: Physician Assistant

## 2021-10-23 ENCOUNTER — Other Ambulatory Visit: Payer: Self-pay | Admitting: Physician Assistant

## 2021-10-23 ENCOUNTER — Encounter
Admission: RE | Disposition: A | Discharge status: Home / Self Care | Payer: Self-pay | Source: Home / Self Care | Attending: Urology

## 2021-10-23 ENCOUNTER — Encounter: Payer: Self-pay | Admitting: Physician Assistant

## 2021-10-23 VITALS — BP 164/94 | HR 64 | Ht 69.0 in | Wt 200.0 lb

## 2021-10-23 DIAGNOSIS — S3994XA Unspecified injury of external genitals, initial encounter: Secondary | ICD-10-CM

## 2021-10-23 DIAGNOSIS — S39840A Fracture of corpus cavernosum penis, initial encounter: Secondary | ICD-10-CM | POA: Diagnosis not present

## 2021-10-23 DIAGNOSIS — N4881 Thrombosis of superficial vein of penis: Secondary | ICD-10-CM | POA: Diagnosis present

## 2021-10-23 DIAGNOSIS — N4889 Other specified disorders of penis: Secondary | ICD-10-CM | POA: Diagnosis not present

## 2021-10-23 DIAGNOSIS — S3021XA Contusion of penis, initial encounter: Secondary | ICD-10-CM

## 2021-10-23 HISTORY — PX: REPAIR OF FRACTURED PENIS: SHX6063

## 2021-10-23 SURGERY — REPAIR, FRACTURE, PENIS
Anesthesia: General

## 2021-10-23 MED ORDER — SODIUM CHLORIDE 0.9 % IV SOLN: INTRAVENOUS | Status: DC

## 2021-10-23 MED ORDER — FENTANYL CITRATE (PF) 100 MCG/2ML IJ SOLN
INTRAMUSCULAR | Status: DC | PRN
  Administered 2021-10-23: 50 ug via INTRAVENOUS

## 2021-10-23 MED ORDER — SODIUM CHLORIDE 0.9 % IR SOLN
Status: DC | PRN
Start: 2021-10-23 — End: 2021-10-23
  Administered 2021-10-23: 100 mL

## 2021-10-23 MED ORDER — CHLORHEXIDINE GLUCONATE 0.12 % MT SOLN
OROMUCOSAL | Status: AC
  Administered 2021-10-23: 15 mL via OROMUCOSAL
  Filled 2021-10-23: qty 15

## 2021-10-23 MED ORDER — LIDOCAINE HCL (CARDIAC) PF 100 MG/5ML IV SOSY
PREFILLED_SYRINGE | INTRAVENOUS | Status: DC | PRN
  Administered 2021-10-23: 80 mg via INTRAVENOUS

## 2021-10-23 MED ORDER — FAMOTIDINE 20 MG PO TABS
ORAL_TABLET | ORAL | Status: AC
  Filled 2021-10-23: qty 1

## 2021-10-23 MED ORDER — LIDOCAINE HCL (PF) 1 % IJ SOLN
INTRAMUSCULAR | Status: AC
  Filled 2021-10-23: qty 30

## 2021-10-23 MED ORDER — DEXAMETHASONE SODIUM PHOSPHATE 10 MG/ML IJ SOLN
INTRAMUSCULAR | Status: DC | PRN
  Administered 2021-10-23: 10 mg via INTRAVENOUS

## 2021-10-23 MED ORDER — PROPOFOL 10 MG/ML IV BOLUS
INTRAVENOUS | Status: AC
  Filled 2021-10-23: qty 20

## 2021-10-23 MED ORDER — ONDANSETRON HCL 4 MG/2ML IJ SOLN
INTRAMUSCULAR | Status: DC | PRN
  Administered 2021-10-23: 4 mg via INTRAVENOUS

## 2021-10-23 MED ORDER — PROPOFOL 10 MG/ML IV BOLUS
INTRAVENOUS | Status: DC | PRN
  Administered 2021-10-23: 180 mg via INTRAVENOUS

## 2021-10-23 MED ORDER — ROCURONIUM BROMIDE 100 MG/10ML IV SOLN
INTRAVENOUS | Status: DC | PRN
  Administered 2021-10-23: 50 mg via INTRAVENOUS

## 2021-10-23 MED ORDER — ORAL CARE MOUTH RINSE: 15.0000 mL | Freq: Once | OROMUCOSAL | Status: AC

## 2021-10-23 MED ORDER — LACTATED RINGERS IV SOLN: INTRAVENOUS | Status: DC

## 2021-10-23 MED ORDER — SUGAMMADEX SODIUM 200 MG/2ML IV SOLN
INTRAVENOUS | Status: DC | PRN
Start: 2021-10-23 — End: 2021-10-23
  Administered 2021-10-23: 200 mg via INTRAVENOUS

## 2021-10-23 MED ORDER — BACITRACIN ZINC 500 UNIT/GM EX OINT
TOPICAL_OINTMENT | CUTANEOUS | Status: AC
  Filled 2021-10-23: qty 28.35

## 2021-10-23 MED ORDER — LIDOCAINE HCL (PF) 1 % IJ SOLN
INTRAMUSCULAR | Status: DC | PRN
  Administered 2021-10-23 (×2): 10 mL

## 2021-10-23 MED ORDER — BUPIVACAINE HCL (PF) 0.5 % IJ SOLN
INTRAMUSCULAR | Status: AC
  Filled 2021-10-23: qty 30

## 2021-10-23 MED ORDER — OXYCODONE-ACETAMINOPHEN 5-325 MG PO TABS: 1.0000 | ORAL_TABLET | ORAL | 0 refills | Status: DC | PRN

## 2021-10-23 MED ORDER — OXYCODONE HCL 5 MG PO TABS
5.0000 mg | ORAL_TABLET | Freq: Once | ORAL | Status: AC | PRN
  Administered 2021-10-23: 5 mg via ORAL

## 2021-10-23 MED ORDER — LACTATED RINGERS IV SOLN: INTRAVENOUS | Status: DC | PRN

## 2021-10-23 MED ORDER — DEXMEDETOMIDINE (PRECEDEX) IN NS 20 MCG/5ML (4 MCG/ML) IV SYRINGE
PREFILLED_SYRINGE | INTRAVENOUS | Status: DC | PRN
  Administered 2021-10-23 (×2): 8 ug via INTRAVENOUS

## 2021-10-23 MED ORDER — MIDAZOLAM HCL 2 MG/2ML IJ SOLN
INTRAMUSCULAR | Status: DC | PRN
  Administered 2021-10-23: 2 mg via INTRAVENOUS

## 2021-10-23 MED ORDER — DEXMEDETOMIDINE HCL IN NACL 80 MCG/20ML IV SOLN
INTRAVENOUS | Status: AC
  Filled 2021-10-23: qty 20

## 2021-10-23 MED ORDER — ONDANSETRON HCL 4 MG/2ML IJ SOLN: 4.0000 mg | Freq: Once | INTRAMUSCULAR | Status: DC | PRN

## 2021-10-23 MED ORDER — CHLORHEXIDINE GLUCONATE 0.12 % MT SOLN: 15.0000 mL | Freq: Once | OROMUCOSAL | Status: AC

## 2021-10-23 MED ORDER — SEVOFLURANE IN SOLN
RESPIRATORY_TRACT | Status: AC
  Filled 2021-10-23: qty 250

## 2021-10-23 MED ORDER — CEFAZOLIN SODIUM-DEXTROSE 2-3 GM-%(50ML) IV SOLR
INTRAVENOUS | Status: DC | PRN
  Administered 2021-10-23: 2 g via INTRAVENOUS

## 2021-10-23 MED ORDER — FENTANYL CITRATE (PF) 100 MCG/2ML IJ SOLN
INTRAMUSCULAR | Status: AC
Start: 2021-10-23 — End: ?
  Filled 2021-10-23: qty 2

## 2021-10-23 MED ORDER — FAMOTIDINE 20 MG PO TABS
ORAL_TABLET | ORAL | Status: AC
Start: 2021-10-23 — End: 2021-10-23
  Administered 2021-10-23: 20 mg
  Filled 2021-10-23: qty 1

## 2021-10-23 MED ORDER — MIDAZOLAM HCL 2 MG/2ML IJ SOLN
INTRAMUSCULAR | Status: AC
  Filled 2021-10-23: qty 2

## 2021-10-23 MED ORDER — FENTANYL CITRATE (PF) 100 MCG/2ML IJ SOLN: 25.0000 ug | INTRAMUSCULAR | Status: DC | PRN

## 2021-10-23 MED ORDER — OXYCODONE HCL 5 MG/5ML PO SOLN: 5.0000 mg | Freq: Once | ORAL | Status: AC | PRN

## 2021-10-23 MED ORDER — OXYCODONE HCL 5 MG PO TABS
ORAL_TABLET | ORAL | Status: AC
  Filled 2021-10-23: qty 1

## 2021-10-23 MED ORDER — ACETAMINOPHEN 10 MG/ML IV SOLN: 1000.0000 mg | Freq: Once | INTRAVENOUS | Status: DC | PRN

## 2021-10-23 SURGICAL SUPPLY — 29 items
BLADE SURG 15 STRL LF DISP TIS (BLADE) ×1 IMPLANT
BLADE SURG 15 STRL SS (BLADE) ×2
BNDG CONFORM 2 STRL LF (GAUZE/BANDAGES/DRESSINGS) ×2 IMPLANT
BNDG GAUZE ELAST 4 BULKY (GAUZE/BANDAGES/DRESSINGS) ×1 IMPLANT
BRUSH SCRUB EZ 1% IODOPHOR (MISCELLANEOUS) ×2 IMPLANT
CATH URETL OPEN 5X70 (CATHETERS) IMPLANT
DRAPE LAPAROTOMY 77X122 PED (DRAPES) ×2 IMPLANT
DRSG TELFA 4X3 1S NADH ST (GAUZE/BANDAGES/DRESSINGS) ×2 IMPLANT
GAUZE 4X4 16PLY ~~LOC~~+RFID DBL (SPONGE) ×3 IMPLANT
GAUZE PETROLATUM 1 X8 (GAUZE/BANDAGES/DRESSINGS) ×1 IMPLANT
GLOVE SURG UNDER POLY LF SZ7.5 (GLOVE) ×2 IMPLANT
GOWN STRL REUS W/ TWL LRG LVL3 (GOWN DISPOSABLE) ×1 IMPLANT
GOWN STRL REUS W/ TWL XL LVL3 (GOWN DISPOSABLE) ×1 IMPLANT
GOWN STRL REUS W/TWL LRG LVL3 (GOWN DISPOSABLE) ×2
GOWN STRL REUS W/TWL XL LVL3 (GOWN DISPOSABLE) ×2
GUIDEWIRE STR DUAL SENSOR (WIRE) IMPLANT
HOOK STAY BLUNT/RETRACTOR 5M (MISCELLANEOUS) ×1 IMPLANT
KIT TURNOVER KIT A (KITS) ×2 IMPLANT
MANIFOLD NEPTUNE II (INSTRUMENTS) ×2 IMPLANT
NEEDLE HYPO 22GX1.5 SAFETY (NEEDLE) ×2 IMPLANT
PACK BASIN MINOR ARMC (MISCELLANEOUS) ×2 IMPLANT
RING RETRACTOR 18.6X8.9 3309G (MISCELLANEOUS) ×1 IMPLANT
SET CYSTO W/LG BORE CLAMP LF (SET/KITS/TRAYS/PACK) IMPLANT
SURGILUBE 2OZ TUBE FLIPTOP (MISCELLANEOUS) ×2 IMPLANT
SUT CHROMIC 3 0 PS 2 (SUTURE) ×3 IMPLANT
SUT CHROMIC 3 0 SH 27 (SUTURE) ×3 IMPLANT
SYR 10ML LL (SYRINGE) ×2 IMPLANT
WATER STERILE IRR 1000ML POUR (IV SOLUTION) ×1 IMPLANT
WATER STERILE IRR 500ML POUR (IV SOLUTION) ×2 IMPLANT

## 2021-10-23 NOTE — Patient Instructions (Signed)
You are scheduled for surgery to repair your penile fracture this afternoon. Call [phone] to find out your arrival time at the same day surgery desk this afternoon. Please do not eat or drink anything until after your surgery. ?

## 2021-10-23 NOTE — Transfer of Care (Signed)
Immediate Anesthesia Transfer of Care Note ? ?Patient: John Walls ? ?Procedure(s) Performed: PENILE EXPLORATION WITH EVACUATION OF HEMATOMA ? ?Patient Location: PACU ? ?Anesthesia Type:General ? ?Level of Consciousness: drowsy and patient cooperative ? ?Airway & Oxygen Therapy: Patient Spontanous Breathing and Patient connected to face mask oxygen ? ?Post-op Assessment: Report given to RN and Post -op Vital signs reviewed and stable ? ?Post vital signs: Reviewed and stable ? ?Last Vitals:  ?Vitals Value Taken Time  ?BP 148/99 10/23/21 1625  ?Temp 36 ?C 10/23/21 1623  ?Pulse 66 10/23/21 1628  ?Resp 21 10/23/21 1628  ?SpO2 99 % 10/23/21 1628  ?Vitals shown include unvalidated device data. ? ?Last Pain:  ?Vitals:  ? 10/23/21 1225  ?TempSrc: Oral  ?PainSc: 0-No pain  ?   ? ?  ? ?Complications: No notable events documented. ?

## 2021-10-23 NOTE — Discharge Instructions (Addendum)
You have a circumcision type incision.  The sutures are absorbable and will fall out on their own. ? ?On postop day 1, i.e. tomorrow you may take off the exterior of the 2 dressings which is a white gauze. ? ?On postop day 2, you remove the rest of the dressings.  You may shower on this day. ? ?If the sutures stick to your clothing, you may use some bacitracin and or Vaseline ointment.  Try to wear tight fitting underwear.  If you have difficulty urinating with the dressing on, you remove all dressings but prefer to try to keep these as long as possible as stated above to reduce the amount of swelling and bruising. ? ?May alternate Tylenol and ibuprofen for pain control.  To have breakthrough pain meds as well.   ? ?Vanna Scotland, MD ? ? ?AMBULATORY SURGERY  ?DISCHARGE INSTRUCTIONS ? ? ?The drugs that you were given will stay in your system until tomorrow so for the next 24 hours you should not: ? ?Drive an automobile ?Make any legal decisions ?Drink any alcoholic beverage ? ? ?You may resume regular meals tomorrow.  Today it is better to start with liquids and gradually work up to solid foods. ? ?You may eat anything you prefer, but it is better to start with liquids, then soup and crackers, and gradually work up to solid foods. ? ? ?Please notify your doctor immediately if you have any unusual bleeding, trouble breathing, redness and pain at the surgery site, drainage, fever, or pain not relieved by medication. ? ? ? ?Additional Instructions: ? ? ? ? ? ? ? ?Please contact your physician with any problems or Same Day Surgery at [phone], Monday through Friday 6 am to 4 pm, or [place] at [facility] number at [phone].  ? ? ? ? ? ?  ? ? ?[facility] ?[address] ?[place], [place]  [address] ?Phone:  [phone] ? ? ?October 23, 2021 ? ?Patient: John Walls  ?Date of Birth: 03-13-1966  ?Date of Visit: October 23, 2021  ? ? ?To Whom It May Concern: ? ?Jah  Reddoch was seen and treated on October 23, 2021 and is able to return to work on Monday 10/30/21   .  ? ?    ? ?  ? ?If you have any questions or concerns, please don't hesitate to call. ? ? ?Sincerely,  ? ? ? ? ? ?Treatment Team:  ?Attending Provider: Vanna Scotland, MD ? ?  ? ?  ? ?

## 2021-10-23 NOTE — Anesthesia Procedure Notes (Signed)
Procedure Name: Intubation ?Date/Time: 10/23/2021 2:57 PM ?Performed by: Jerrye Noble, CRNA ?Pre-anesthesia Checklist: Patient identified, Emergency Drugs available, Suction available and Patient being monitored ?Patient Re-evaluated:Patient Re-evaluated prior to induction ?Oxygen Delivery Method: Circle system utilized ?Preoxygenation: Pre-oxygenation with 100% oxygen ?Induction Type: IV induction ?Ventilation: Mask ventilation without difficulty ?Laryngoscope Size: McGraph and 4 ?Grade View: Grade I ?Tube type: Oral ?Number of attempts: 1 ?Airway Equipment and Method: Stylet, Oral airway and Video-laryngoscopy ?Placement Confirmation: ETT inserted through vocal cords under direct vision, positive ETCO2 and breath sounds checked- equal and bilateral ?Secured at: 22 cm ?Tube secured with: Tape ?Dental Injury: Teeth and Oropharynx as per pre-operative assessment  ? ? ? ? ?

## 2021-10-23 NOTE — H&P (View-Only) (Signed)
? ?10/23/2021 ?9:36 AM  ? ?John Walls ?09/11/1965 ?[id number] ? ?CC: ?Chief Complaint  ?Patient presents with  ? Penis Injury  ? ?HPI: ?John Walls is a 56 y.o. male who presents today for evaluation of penile fracture.  ? ?Patient reports he was having intercourse with his wife Saturday evening around 11 PM when he felt sudden onset of pain and swelling with detumescence.  He has not noticed any gross hematuria. ? ?He is NPO this morning. ? ?In-office UA today pan negative; urine microscopy with uric acid crystals.  ? ?PMH: ?Past Medical History:  ?Diagnosis Date  ? Bronchitis, acute   ? Depression   ? Drug abuse ([facility]) 1990  ? 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 ? ?Surgical History: ?Past Surgical History:  ?Procedure Laterality Date  ? CYST EXCISION  Age 14  ? from tailbone  ? ELBOW SURGERY Right 2017  ? fractured elbow  ? MOUTH SURGERY  2014  ? TOOTH EXTRACTION    ? ? ?Home Medications:  ?Allergies as of 10/23/2021   ?No Known Allergies ?  ? ?  ?Medication List  ?  ? ?  ? Accurate as of October 23, 2021  9:36 AM. If you have any questions, ask your nurse or doctor.  ?  ?  ? ?  ? ?diclofenac Sodium 1 % Gel ?Commonly known as: VOLTAREN ?Apply 2 g topically 3 (three) times daily. ?  ? ?  ? ? ?Allergies:  ?No Known Allergies ? ?Family History: ?Family History  ?Problem Relation Age of Onset  ? Arthritis Mother   ? Heart disease Father   ? Stroke Father   ? Hypertension Father   ? Hyperlipidemia Father   ? Aneurysm Father   ?     brain  ? Alcohol abuse Sister   ? Drug abuse Sister   ? Depression Sister   ? Early death Sister   ?     homicide  ? Mental illness Sister   ? Alcohol abuse Brother   ? Drug abuse Brother   ? Mental illness Brother   ? Cancer Neg Hx   ? Diabetes Neg Hx   ? ? ?Social History:  ? reports that he quit smoking about 13 years ago. His smoking use included cigarettes. He has never used smokeless tobacco. He reports current alcohol use. He reports that he does  not use drugs. ? ?Physical Exam: ?BP (!) 164/94   Pulse 64   Ht 5' 9" (1.753 m)   Wt 200 lb (90.7 kg)   BMI 29.53 kg/m?   ?Constitutional:  Alert and oriented, no acute distress, nontoxic appearing ?HEENT: [place], AT ?Cardiovascular: No clubbing, cyanosis, or edema ?Respiratory: Normal respiratory effort, no increased work of breathing ?GU: Diffuse penile edema and distal bruising, photos taken and added to media tab ?Skin: No rashes, bruises or suspicious lesions ?Neurologic: Grossly intact, no focal deficits, moving all 4 extremities ?Psychiatric: Normal mood and affect ? ?Laboratory Data: ?See Epic ? ?Assessment & Plan:   ?1. Fracture of corpus cavernosum penis, initial encounter ?Penile fracture x2 days.  No microscopic hematuria, reassuring for urethral involvement.  I recommended proceeding to the OR today for penile exploration and fracture repair with Dr. Brandon.  Patient is in agreement with this plan. ? ?We discussed 4 to 6 weeks of postoperative healing time requiring abstinence and a temporary Foley catheter.  Counseled patient to remain n.p.o. in advance of his procedure this afternoon.    All questions answered. ?- Urinalysis, Complete ? ?Return today (on 10/23/2021) for Penile exploration and fracture repair with Dr. Brandon. ? ?Anniston Nellums, PA-C ? ?[facility] ?[address], [address] ?[place], [place] [address] ?[phone] ?   ?

## 2021-10-23 NOTE — Progress Notes (Signed)
? ?10/23/2021 ?9:36 AM  ? ?John Walls ?[birth year] ?[id number] ? ?CC: ?Chief Complaint  ?Patient presents with  ? Penis Injury  ? ?HPI: ?John Walls is a 56 y.o. male who presents today for evaluation of penile fracture.  ? ?Patient reports he was having intercourse with his wife Saturday evening around 11 PM when he felt sudden onset of pain and swelling with detumescence.  He has not noticed any gross hematuria. ? ?He is NPO this morning. ? ?In-office UA today pan negative; urine microscopy with uric acid crystals.  ? ?PMH: ?Past Medical History:  ?Diagnosis Date  ? Bronchitis, acute   ? Depression   ? Drug abuse ([place]) 1990  ? 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 ? ?Surgical History: ?Past Surgical History:  ?Procedure Laterality Date  ? CYST EXCISION  Age 72  ? from tailbone  ? ELBOW SURGERY Right 2017  ? fractured elbow  ? MOUTH SURGERY  2014  ? TOOTH EXTRACTION    ? ? ?Home Medications:  ?Allergies as of 10/23/2021   ?No Known Allergies ?  ? ?  ?Medication List  ?  ? ?  ? Accurate as of October 23, 2021  9:36 AM. If you have any questions, ask your nurse or doctor.  ?  ?  ? ?  ? ?diclofenac Sodium 1 % Gel ?Commonly known as: VOLTAREN ?Apply 2 g topically 3 (three) times daily. ?  ? ?  ? ? ?Allergies:  ?No Known Allergies ? ?Family History: ?Family History  ?Problem Relation Age of Onset  ? Arthritis Mother   ? Heart disease Father   ? Stroke Father   ? Hypertension Father   ? Hyperlipidemia Father   ? Aneurysm Father   ?     brain  ? Alcohol abuse Sister   ? Drug abuse Sister   ? Depression Sister   ? Early death Sister   ?     homicide  ? Mental illness Sister   ? Alcohol abuse Brother   ? Drug abuse Brother   ? Mental illness Brother   ? Cancer Neg Hx   ? Diabetes Neg Hx   ? ? ?Social History:  ? reports that he quit smoking about 13 years ago. His smoking use included cigarettes. He has never used smokeless tobacco. He reports current alcohol use. He reports that he does  not use drugs. ? ?Physical Exam: ?BP (!) 164/94   Pulse 64   Ht 5\' 9"  (1.753 m)   Wt 200 lb (90.7 kg)   BMI 29.53 kg/m?   ?Constitutional:  Alert and oriented, no acute distress, nontoxic appearing ?HEENT: [place], AT ?Cardiovascular: No clubbing, cyanosis, or edema ?Respiratory: Normal respiratory effort, no increased work of breathing ?GU: Diffuse penile edema and distal bruising, photos taken and added to media tab ?Skin: No rashes, bruises or suspicious lesions ?Neurologic: Grossly intact, no focal deficits, moving all 4 extremities ?Psychiatric: Normal mood and affect ? ?Laboratory Data: ?See Epic ? ?Assessment & Plan:   ?1. Fracture of corpus cavernosum penis, initial encounter ?Penile fracture x2 days.  No microscopic hematuria, reassuring for urethral involvement.  I recommended proceeding to the OR today for penile exploration and fracture repair with Dr. Erlene Quan.  Patient is in agreement with this plan. ? ?We discussed 4 to 6 weeks of postoperative healing time requiring abstinence and a temporary Foley catheter.  Counseled patient to remain n.p.o. in advance of his procedure this afternoon.  All questions answered. ?- Urinalysis, Complete ? ?Return today (on 10/23/2021) for Penile exploration and fracture repair with Dr. Erlene Quan. ? ?Debroah Loop, PA-C ? ?[facility] ?[address], [address] ?[place], [place] [address] ?[phone] ?   ?

## 2021-10-23 NOTE — Anesthesia Preprocedure Evaluation (Addendum)
Anesthesia Evaluation  ?Patient identified by MRN, date of birth, ID band ?Patient awake ? ? ? ?Reviewed: ?Allergy & Precautions, NPO status , Patient's Chart, lab work & pertinent test results ? ?History of Anesthesia Complications ?Negative for: history of anesthetic complications ? ?Airway ?Mallampati: II ? ? ?Neck ROM: Full ? ? ? Dental ? ? ?Missing all but 2 lower teeth:   ?Pulmonary ?former smoker (quit 2010),  ?  ?Pulmonary exam normal ?breath sounds clear to auscultation ? ? ? ? ? ? Cardiovascular ?Exercise Tolerance: Good ?negative cardio ROS ?Normal cardiovascular exam ?Rhythm:Regular Rate:Normal ? ? ?  ?Neuro/Psych ?PSYCHIATRIC DISORDERS Depression Hx cocaine use, last use 1980s; marijuana use ?  ? GI/Hepatic ?negative GI ROS,   ?Endo/Other  ?negative endocrine ROS ? Renal/GU ?negative Renal ROS  ? ?  ?Musculoskeletal ? ? Abdominal ?  ?Peds ? Hematology ?negative hematology ROS ?(+)   ?Anesthesia Other Findings ? ? Reproductive/Obstetrics ? ?  ? ? ? ? ? ? ? ? ? ? ? ? ? ?  ?  ? ? ? ? ? ? ?Anesthesia Physical ?Anesthesia Plan ? ?ASA: 2 and emergent ? ?Anesthesia Plan: General  ? ?Post-op Pain Management:   ? ?Induction: Intravenous ? ?PONV Risk Score and Plan: 2 and Ondansetron, Dexamethasone and Treatment may vary due to age or medical condition ? ?Airway Management Planned: Oral ETT ? ?Additional Equipment:  ? ?Intra-op Plan:  ? ?Post-operative Plan: Extubation in OR ? ?Informed Consent: I have reviewed the patients History and Physical, chart, labs and discussed the procedure including the risks, benefits and alternatives for the proposed anesthesia with the patient or authorized representative who has indicated his/her understanding and acceptance.  ? ? ? ?Dental advisory given ? ?Plan Discussed with: CRNA ? ?Anesthesia Plan Comments: (Patient consented for risks of anesthesia including but not limited to:  ?- adverse reactions to medications ?- damage to eyes, teeth,  lips or other oral mucosa ?- nerve damage due to positioning  ?- sore throat or hoarseness ?- damage to heart, brain, nerves, lungs, other parts of body or loss of life ? ?Informed patient about role of CRNA in peri- and intra-operative care.  Patient voiced understanding.)  ? ? ? ? ? ?Anesthesia Quick Evaluation ? ?

## 2021-10-23 NOTE — Op Note (Signed)
Date of procedure: 10/23/21 ? ?Preoperative diagnosis:  ?Penile injury/penile hematoma ? ?Postoperative diagnosis:  ?Penile hematoma ?Thrombosis/to the superficial dorsal vein ? ?Procedure: ?Penile exploration ?Evacuation of penile hematoma ?Induction of artificial erection ? ?Surgeon: Vanna Scotland, MD ? ?Anesthesia: General ? ?Complications: None ? ?Intraoperative findings: Circumcision incision to degloved the penis.  2 cm area of the superficial dorsal vein in the mid shaft with surrounding hematoma and was felt to be probable thrombosis consistent with injury to the structure.  No penile fracture identified status post induction of artificial penile erection.  No other penile injuries appreciated.  Clot/hematoma evacuated. ? ?EBL: Minimal ? ?Specimens: None ? ?Drains: None ? ?Indication: John Walls is a 56 y.o. patient with penile trauma during intercourse who reported to me in the preoperative holding area that he felt some pain and then had a slow loss of his erection rather than an acute pop and loss and subsequent slowly evolving swelling.  He denies any significant pain other than 1 area on the dorsal aspect of his midshaft.  After reviewing the management options for treatment, he elected to proceed with the above surgical procedure(s). We have discussed the potential benefits and risks of the procedure, side effects of the proposed treatment, the likelihood of the patient achieving the goals of the procedure, and any potential problems that might occur during the procedure or recuperation. Informed consent has been obtained. ? ?Description of procedure: ? ?The patient was taken to the operating room and general anesthesia was induced.  The patient was placed in the supine position, prepped and draped in the usual sterile fashion, and preoperative antibiotics were administered. A preoperative time-out was performed.  ? ?Exam indicated induration/hematoma at the dorsal midshaft with the patient  experienced the initial discomfort and swelling along with edema/swelling which appeared to have dependently traveled to the ventral frenular area.  A penile block was performed with 10 cc of 1% lidocaine for local anesthetic.  I then used a 15 blade to create a circumcision incision opening up a large area of hematoma and edema.  This was manually evacuated with compression at which time the aesthetics of the penis improved greatly.  I then carefully degloved the penis all the way down to the penoscrotal junction evaluating all structures including the urethra which appeared to be completely intact and without disruption.  I did encounter an area of significant abnormality at the described location of the injury which was about 2 cm in length involving the superficial dorsal vein of the penis which appeared to be the probable source of bleeding with surrounding hematoma, ecchymosis, and a firm area which likely represented some thrombosis within this structure.  I carefully evaluated in and around this area including just to the left of this where the hematoma extended opening up Buck's fascia down to the tunica at this area laterally and no clear defect was identified.  I then placed a tourniquet at the base of the penis and induced an artificial erection with 60 cc of injectable saline by injecting in the right mid shaft with a 20-gauge needle.  This created an adequately firm erection.  The entirety of the penis that was exposed was carefully evaluated and the erection was able to be maintained without detumescence and there was no areas of extravasation of any of the fluid material.  This was consistent with no identifiable penile fracture.  The superficial dorsal vein was likely the source of the hematoma which appeared to be no longer bleeding.  Careful and adequate hemostasis was achieved along the length of the shaft.  I further evacuated additional clot and blood product from the remainder of the shaft and  frenular area.  Additional lidocaine was performed bided 10 cc into ring block at the base of the shaft.  I then brought together the skin edges and used similar interrupted 3-0 chromic sutures using a cutting needle.  I did use a U stitch at the frenulum.  Aesthetically, the penis was greatly improved in appearance with evacuation of the clot.  He was cleaned and dried.  I applied Vaseline gauze conformer and Coban as a circumcision type dressing him to care to ensure that this was tight but not too tight and included the glans in this dressing in order to avoid any complications of necrosis.  I then used a Kerlix to create a penoscrotal dressing i.e. mummy dressing.  He was then placed in a scrotal support device, reversed of anesthesia and taken the PACU in stable condition. ? ?Plan: He may remove the Kerlix penoscrotal dressing tomorrow.  I like him to keep his circumcision type dressing for 2 days.  If he is unable to void, he can remove all of the dressings.  We will see him in a month for wound check. ? ?Vanna Scotland, M.D. ? ?

## 2021-10-23 NOTE — Interval H&P Note (Signed)
H&P up-to-date, no changes ? ?regular ? ?On exam, he has significant dependent penile edema, points to dorsum midshaft at the site of the initial swelling.  Nontender in this area.  Rate and rhythm ?Clear to auscultation bilaterally. ? ?All questions were answered ?

## 2021-10-24 ENCOUNTER — Encounter: Payer: Self-pay | Admitting: Urology

## 2021-10-24 LAB — MICROSCOPIC EXAMINATION

## 2021-10-24 LAB — URINALYSIS, COMPLETE
Bilirubin, UA: NEGATIVE
Glucose, UA: NEGATIVE
Ketones, UA: NEGATIVE
Leukocytes,UA: NEGATIVE
Nitrite, UA: NEGATIVE
Protein,UA: NEGATIVE
RBC, UA: NEGATIVE
Specific Gravity, UA: >1.03 — ABNORMAL HIGH (ref 1.005–1.030)
Urobilinogen, Ur: 0.2 mg/dL (ref 0.2–1.0)
pH, UA: 5.5 (ref 5.0–7.5)

## 2021-10-25 ENCOUNTER — Telehealth: Payer: Self-pay | Admitting: *Deleted

## 2021-10-25 NOTE — Telephone Encounter (Signed)
Appt scheduled

## 2021-10-25 NOTE — Telephone Encounter (Signed)
Pt called again. ?Pt changed his dressing and it was a lot of bleeding, he feels like he should not have done that on his on, that he needed help. Pt works at [employer] and thinks sitting will be uncomfortable??? I advised that a week is normal. Pt disagrees. ?Please advise ?

## 2021-10-25 NOTE — Telephone Encounter (Signed)
Patient calling asking for her return to date work to be pushed back to Wednesday 11/01/2021, pt's original return to work is Monday 10/30/2021. Please advise ?

## 2021-10-25 NOTE — Telephone Encounter (Signed)
Please offer him to see Sam or Carollee Herter Friday for wound check ? ?Vanna Scotland, MD ? ?

## 2021-10-25 NOTE — Telephone Encounter (Signed)
I really do not feel that this is necessary.  He told me that his occupation involves mostly sitting.  Can you please address his concerns? ? ?Vanna Scotland, MD ? ?

## 2021-10-26 ENCOUNTER — Ambulatory Visit (INDEPENDENT_AMBULATORY_CARE_PROVIDER_SITE_OTHER): Payer: BLUE CROSS/BLUE SHIELD | Admitting: Urology

## 2021-10-26 VITALS — BP 161/100 | HR 59 | Ht 69.0 in | Wt 200.0 lb

## 2021-10-26 DIAGNOSIS — S3021XD Contusion of penis, subsequent encounter: Secondary | ICD-10-CM

## 2021-10-26 DIAGNOSIS — N4889 Other specified disorders of penis: Secondary | ICD-10-CM

## 2021-10-26 NOTE — Progress Notes (Signed)
11/01/21 ?10:17 AM  ? ?John Walls ?11/16/1965 ?[id number] ? ?Referring provider:  ?Eustaquio BoydenGutierrez, Javier, MD ?[address] [place] ?[place],  [place] [id number] ? ?Chief Complaint  ?Patient presents with  ? Wound Check  ? ?Urological history: ? Penile hematoma ?- S/p penile exploration, evacuation of penile hematoma. Intraoperative findings: Circumcision incision to degloved the penis.  2 cm area of the superficial dorsal vein in the mid shaft with surrounding hematoma and was felt to be probable thrombosis consistent with injury to the structure.  No penile fracture identified status post induction of artificial penile erection.  No other penile injuries appreciated.  Clot/hematoma evacuated on October 23, 2021 with Dr. Vanna ScotlandAshley Walls ? ? ?HPI: ?John Walls is a 56 y.o.male who presents today for a wound check.  ? ?He reports tenderness and discomfort of penis. He reports that he had a lot of bleeding when he removed the bandages. He placed hydrogen peroxide on his wound.  ? ?He has not noted any purulent drainage from the incisions, he is denied any fevers chills. ? ?PMH: ?Past Medical History:  ?Diagnosis Date  ? Bronchitis, acute   ? Depression   ? Drug abuse (HCC) 1990  ? 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 ? ?Surgical History: ?Past Surgical History:  ?Procedure Laterality Date  ? CYST EXCISION  Age 56  ? from tailbone  ? ELBOW SURGERY Right 2017  ? fractured elbow  ? MOUTH SURGERY  2014  ? REPAIR OF FRACTURED PENIS N/A 10/23/2021  ? Procedure: PENILE EXPLORATION WITH EVACUATION OF HEMATOMA;  Surgeon: John ScotlandBrandon, Ashley, MD;  Location: [facility] ORS;  Service: Urology;  Laterality: N/A;  ? TOOTH EXTRACTION    ? ? ?Home Medications:  ?Allergies as of 10/26/2021   ?No Known Allergies ?  ? ?  ?Medication List  ?  ? ?  ? Accurate as of October 26, 2021 11:59 PM. If you have any questions, ask your nurse or doctor.  ?  ?  ? ?  ? ?diclofenac Sodium 1 % Gel ?Commonly known as: VOLTAREN ?Apply 2 g  topically 3 (three) times daily. ?  ?ibuprofen 200 MG tablet ?Commonly known as: ADVIL ?Take 200 mg by mouth every 6 (six) hours as needed. ?  ?oxyCODONE-acetaminophen 5-325 MG tablet ?Commonly known as: Percocet ?Take 1-2 tablets by mouth every 4 (four) hours as needed for moderate pain or severe pain. ?  ? ?  ? ? ?Allergies:  ?No Known Allergies ? ?Family History: ?Family History  ?Problem Relation Age of Onset  ? Arthritis Mother   ? Heart disease Father   ? Stroke Father   ? Hypertension Father   ? Hyperlipidemia Father   ? Aneurysm Father   ?     brain  ? Alcohol abuse Sister   ? Drug abuse Sister   ? Depression Sister   ? Early death Sister   ?     homicide  ? Mental illness Sister   ? Alcohol abuse Brother   ? Drug abuse Brother   ? Mental illness Brother   ? Cancer Neg Hx   ? Diabetes Neg Hx   ? ? ?Social History:  reports that he quit smoking about 13 years ago. His smoking use included cigarettes. He has never used smokeless tobacco. He reports current alcohol use. He reports that he does not use drugs. ? ? ?Physical Exam: ?BP (!) 161/100   Pulse (!) 59   Ht 5\' 9"  (1.753  m)   Wt 200 lb (90.7 kg)   BMI 29.53 kg/m?   ?Constitutional:  Alert and oriented, No acute distress. ?HEENT: [place] AT, moist mucus membranes.  Trachea midline, no masses. ?Cardiovascular: No clubbing, cyanosis, or edema. ?Respiratory: Normal respiratory effort, no increased work of breathing. ?GU: Mild penile bruising and some mild suprapubic bruising noted.  His incision are intact, dry and clean. There is some mild penile swelling but no purulent drainage, no erythema and no crepitus noted.  There is some mild tenderness with induration at the base of the penis.  ?Neurologic: Grossly intact, no focal deficits, moving all 4 extremities. ?Psychiatric: Normal mood and affect. ? ?Laboratory Data: ? ?Lab Results  ?Component Value Date  ? CREATININE 1.36 (H) 10/22/2021  ? ?Component ?    Latest Ref Rng 10/22/2021 10/23/2021  ?WBC ?    4.0 -  10.5 K/uL 4.8    ?RBC ?    0 - 2 /hpf 4.56  0-2   ?Hemoglobin ?    13.0 - 17.0 g/dL 70.7    ?HCT ?    39.0 - 52.0 % 43.6    ?MCV ?    80.0 - 100.0 fL 95.6    ?MCH ?    26.0 - 34.0 pg 31.1    ?MCHC ?    30.0 - 36.0 g/dL 86.7    ?RDW ?    11.5 - 15.5 % 12.5    ?Platelets ?    150 - 400 K/uL 192    ?nRBC ?    0.0 - 0.2 % 0.0    ?WBC, UA ?    0 - 5 /hpf  0-5   ?Epithelial Cells (non renal) ?    0 - 10 /hpf  0-10   ?Crystals ?    N/A   Present !   ?Crystal Type ?    N/A   Uric Acid   ?Bacteria, UA ?    None seen/Few   Few   ?  ?! Abnormal ?Component ?    Latest Ref Rng 10/22/2021  ?Sodium ?    135 - 145 mmol/L 138   ?Potassium ?    3.5 - 5.1 mmol/L 4.0   ?Chloride ?    98 - 111 mmol/L 106   ?CO2 ?    22 - 32 mmol/L 25   ?Glucose ?    70 - 99 mg/dL 544 (H)   ?BUN ?    6 - 20 mg/dL 28 (H)   ?Creatinine ?    0.61 - 1.24 mg/dL 9.20 (H)   ?Calcium ?    8.9 - 10.3 mg/dL 8.7 (L)   ?GFR, Estimated ?    >60 mL/min >60   ?Anion gap ?    5 - 15  7   ?  ?(H) High ?(L) Low ?Component ?    Latest Ref Rng 10/23/2021  ?RBC ?    0 - 2 /hpf 0-2   ?WBC, UA ?    0 - 5 /hpf 0-5   ?Epithelial Cells (non renal) ?    0 - 10 /hpf 0-10   ?Crystals ?    N/A  Present !   ?Crystal Type ?    N/A  Uric Acid   ?Bacteria, UA ?    None seen/Few  Few   ?I have reviewed the labs.  ? ?Assessment & Plan:   ? ?Penile hematoma  ?- Exam was reassuring   ?- His bandages were changed today  ?-  He was given non adhesive gauze supplies to change today will wrap with crilex on days he goes to work. Advised him to be without dressing when he is not at work.  ?- Refrain from using hydrogen peroxide.  ? ?Return for follow up on 11/22/2021 with Dr. Apolinar Junes. ? ?[facility] ?[address] ?[place], [place] [address] ?[phone] ? ?I,John Walls,acting as a Neurosurgeon for [employer], PA-C.,have documented all relevant documentation on the behalf of John Bertram, PA-C,as directed by  [facility], PA-C while in the presence  of John Seyfried, PA-C. ? ?I have reviewed the above documentation for accuracy and completeness, and I agree with the above.   ? ?Michiel Cowboy, PA-C  ?

## 2021-10-26 NOTE — Anesthesia Postprocedure Evaluation (Signed)
Anesthesia Post Note ? ?Patient: John Walls ? ?Procedure(s) Performed: PENILE EXPLORATION WITH EVACUATION OF HEMATOMA ? ?Patient location during evaluation: PACU ?Anesthesia Type: General ?Level of consciousness: awake and alert ?Pain management: pain level controlled ?Vital Signs Assessment: post-procedure vital signs reviewed and stable ?Respiratory status: spontaneous breathing, nonlabored ventilation, respiratory function stable and patient connected to nasal cannula oxygen ?Cardiovascular status: blood pressure returned to baseline and stable ?Postop Assessment: no apparent nausea or vomiting ?Anesthetic complications: no ? ? ?No notable events documented. ? ? ?Last Vitals:  ?Vitals:  ? 10/23/21 1705 10/23/21 1738  ?BP: (!) 153/101 (!) 164/102  ?Pulse: 63 (!) 56  ?Resp: 18 18  ?Temp: (!) 36.3 ?C (!) 36.2 ?C  ?SpO2: 97% 100%  ?  ?Last Pain:  ?Vitals:  ? 10/23/21 1738  ?TempSrc: Temporal  ?PainSc: 5   ? ? ?  ?  ?  ?  ?  ?  ? ?Lenard Simmer ? ? ? ? ?

## 2021-11-01 ENCOUNTER — Encounter: Payer: Self-pay | Admitting: Urology

## 2021-11-06 ENCOUNTER — Telehealth: Payer: Self-pay | Admitting: *Deleted

## 2021-11-06 NOTE — Telephone Encounter (Signed)
Pt calling stating that he tried to go back to work today and he's still sore. I advised pt per verbal from Dr. Apolinar Junes to take ibuprofen. Pt states he would try. ?

## 2021-11-08 ENCOUNTER — Telehealth: Payer: Self-pay | Admitting: *Deleted

## 2021-11-08 NOTE — Telephone Encounter (Signed)
Claims at [facility] calling to confirm pt's appt on 11/21/2021 and was it ok for him to be out until that date?  ? ?I called left message for claims to return my call, based on verbal conversations that pt should have returned to work on 10/31/2021 ?

## 2021-11-16 ENCOUNTER — Telehealth: Payer: Self-pay

## 2021-11-16 NOTE — Telephone Encounter (Signed)
Incoming call on triage line from pt regarding swelling post penile fracture. Pt states its almost 4 weeks since surgery and he is still experiencing swelling and would like to know if this is normal. Pt also wanted to know if anyone had called in regards to his disability paperwork. Advised pt of phone encounter on 11/08/21 regarding the disability paperwork. Per Carman Ching, swelling is normal and pt can expect to experience it a few weeks longer. Pt expressed understanding. ?

## 2021-11-17 ENCOUNTER — Telehealth: Payer: Self-pay

## 2021-11-17 NOTE — Telephone Encounter (Signed)
Left message on VM with detailed results, that pt was not approved by Dr. Apolinar Junes to be out of work, pt should have have returned. ?

## 2021-11-17 NOTE — Telephone Encounter (Signed)
Incoming VM on triage line from Erin Fulling from [facility] disability. She is seeking certification from someone in the office for the pt being out May 1st until his upcoming appt on May 16th. She would like a call back - [phone] ?

## 2021-11-20 NOTE — Progress Notes (Signed)
? ?11/21/2021 ?7:01 PM  ? ?John Walls ?10-18-1965 ?[id number] ? ?Referring provider:  ?Eustaquio Boyden, MD ?[address] [place] ?[place],  [place] [address] ?No chief complaint on file. ? ? ?HPI: ?John Walls is a 56 y.o.male with a personal history of penile hematoma who presents today for a post op follow-up.  ? ?S/p penile exploration, evacuation of penile hematoma. Intraoperative findings: Circumcision incision to degloved the penis.  2 cm area of the superficial dorsal vein in the mid shaft with surrounding hematoma and was felt to be probable thrombosis consistent with injury to the structure.  No penile fracture identified status post induction of artificial penile erection.  No other penile injuries appreciated.  Clot/hematoma evacuated on October 23, 2021.  ? ?He was seen by Michiel Cowboy, PA-C on 11/01/2021 for a wound check.  ? ?He reports today that he obtained a partial erection.  This was not completely firm but straight.  He has not tried to have sexual intercourse or any other sexual acts since the injury out of concern for healing. ? ?He does continue to have some mild penile discomfort and ongoing swelling which seems to be improving.  No urinary complaints. ? ? ? ?PMH: ?Past Medical History:  ?Diagnosis Date  ? Bronchitis, acute   ? Depression   ? Drug abuse (HCC) 1990  ? 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 ? ?Surgical History: ?Past Surgical History:  ?Procedure Laterality Date  ? CYST EXCISION  Age 66  ? from tailbone  ? ELBOW SURGERY Right 2017  ? fractured elbow  ? MOUTH SURGERY  2014  ? REPAIR OF FRACTURED PENIS N/A 10/23/2021  ? Procedure: PENILE EXPLORATION WITH EVACUATION OF HEMATOMA;  Surgeon: Vanna Scotland, MD;  Location: [facility] ORS;  Service: Urology;  Laterality: N/A;  ? TOOTH EXTRACTION    ? ? ?Home Medications:  ?Allergies as of 11/21/2021   ?No Known Allergies ?  ? ?  ?Medication List  ?  ? ?  ? Accurate as of Nov 20, 2021  7:01 PM. If you have  any questions, ask your nurse or doctor.  ?  ?  ? ?  ? ?diclofenac Sodium 1 % Gel ?Commonly known as: VOLTAREN ?Apply 2 g topically 3 (three) times daily. ?  ?ibuprofen 200 MG tablet ?Commonly known as: ADVIL ?Take 200 mg by mouth every 6 (six) hours as needed. ?  ?oxyCODONE-acetaminophen 5-325 MG tablet ?Commonly known as: Percocet ?Take 1-2 tablets by mouth every 4 (four) hours as needed for moderate pain or severe pain. ?  ? ?  ? ? ?Allergies:  ?No Known Allergies ? ?Family History: ?Family History  ?Problem Relation Age of Onset  ? Arthritis Mother   ? Heart disease Father   ? Stroke Father   ? Hypertension Father   ? Hyperlipidemia Father   ? Aneurysm Father   ?     brain  ? Alcohol abuse Sister   ? Drug abuse Sister   ? Depression Sister   ? Early death Sister   ?     homicide  ? Mental illness Sister   ? Alcohol abuse Brother   ? Drug abuse Brother   ? Mental illness Brother   ? Cancer Neg Hx   ? Diabetes Neg Hx   ? ? ?Social History:  reports that he quit smoking about 13 years ago. His smoking use included cigarettes. He has never used smokeless tobacco. He reports current alcohol use.  He reports that he does not use drugs. ? ? ?Physical Exam: ?There were no vitals taken for this visit.  ?Constitutional:  Alert and oriented, No acute distress. ?HEENT: [place] AT, moist mucus membranes.  Trachea midline, no masses. ?Cardiovascular: No clubbing, cyanosis, or edema. ?Respiratory: Normal respiratory effort, no increased work of breathing. ?GI: Circumcised well healed circumcision incision he still has some mild to moderate penile edema especially on the dependent penis. Normal scrotum   ?Skin: No rashes, bruises or suspicious lesions. ?Neurologic: Grossly intact, no focal deficits, moving all 4 extremities. ?Psychiatric: Normal mood and affect. ? ?Laboratory Data: ?Lab Results  ?Component Value Date  ? CREATININE 1.36 (H) 10/22/2021  ? ? ?Assessment & Plan:   ?Penile swelling/history of penile hematoma ?- Recommend  compressive care with compressive underwear or shorts and penile massage. Should improve with time.  ?-Cleared for all sexual and physical activity ? ?F/u prn ? ?I,Kailey Littlejohn,acting as a scribe for Vanna Scotland, MD.,have documented all relevant documentation on the behalf of Vanna Scotland, MD,as directed by  Vanna Scotland, MD while in the presence of Vanna Scotland, MD. ? ?I have reviewed the above documentation for accuracy and completeness, and I agree with the above.  ? ?Vanna Scotland, MD ? ? ?[facility] ?[address], [address] ?[place], [place] [address] ?[phone] ?

## 2021-11-21 ENCOUNTER — Encounter: Payer: Self-pay | Admitting: Urology

## 2021-11-21 ENCOUNTER — Ambulatory Visit (INDEPENDENT_AMBULATORY_CARE_PROVIDER_SITE_OTHER): Payer: BLUE CROSS/BLUE SHIELD | Admitting: Urology

## 2021-11-21 VITALS — BP 167/109 | HR 58 | Ht 69.0 in | Wt 200.0 lb

## 2021-11-21 DIAGNOSIS — N4889 Other specified disorders of penis: Secondary | ICD-10-CM

## 2021-12-13 ENCOUNTER — Telehealth: Payer: Self-pay

## 2021-12-13 NOTE — Telephone Encounter (Signed)
Patient called complaining of soreness and pain near the incision. Pt denies any trauma to the area, fevers, or any other symptoms at this time. Pt was offered an appointment for tomorrow which he declined. Pt wanted to come in Friday. Advised pt if worsening pain or symptoms to go to ER. Pt verbalized understanding.

## 2021-12-14 NOTE — Progress Notes (Signed)
12/15/21 11:55 AM   John Walls June 16, 1966 [id number]  Referring provider:  Eustaquio Boyden, MD [address] [place],  [place] [address]  Urological history:  Penile hematoma - S/p penile exploration, evacuation of penile hematoma. Intraoperative findings: Circumcision incision to degloved the penis.  2 cm area of the superficial dorsal vein in the mid shaft with surrounding hematoma and was felt to be probable thrombosis consistent with injury to the structure.  No penile fracture identified status post induction of artificial penile erection.  No other penile injuries appreciated.  Clot/hematoma evacuated on October 23, 2021 with Dr. Vanna Scotland  Chief Complaint  Patient presents with   Penis Pain    HPI: John Walls is a 56 y.o.male for penile pain.   He is concerned as he is still having swelling on the front end of his penis, the incision part he sometimes experiences sharp pain and the vein on top is painful as well.  He states he is having no pain with erections, no pain during intercourse and no pain with ejaculation.  He also is not having any issues with urination.  He denies any hematospermia as well.  There has been no drainage from the suture area, he has not had fevers, chills, nausea or vomiting.  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 If you have any questions, ask your nurse or doctor.          diclofenac Sodium 1 % Gel Commonly known as: VOLTAREN Apply 2 g topically 3 (three) times daily.   ibuprofen 200 MG tablet Commonly known as: ADVIL Take 200 mg by mouth every 6 (six) hours as needed.   oxyCODONE-acetaminophen 5-325 MG tablet Commonly known as: PERCOCET/ROXICET Take by mouth every 4 (four) hours as needed for severe pain.        Allergies:  No Known Allergies  Family History: Family History  Problem Relation Age of Onset   Arthritis Mother    Heart disease Father    Stroke Father    Hypertension Father    Hyperlipidemia Father    Aneurysm Father        brain   Alcohol abuse Sister    Drug abuse Sister    Depression Sister    Early death Sister        homicide   Mental illness Sister    Alcohol abuse Brother    Drug abuse Brother    Mental illness Brother    Cancer Neg Hx    Diabetes Neg Hx     Social History:  reports that he quit smoking about 13 years ago. His smoking use included cigarettes. He has never used  smokeless tobacco. He reports current alcohol use. He reports that he does not use drugs.   Physical Exam: BP (!) 164/106   Pulse 80   Ht 5\' 9"  (1.753 m)   Wt 200 lb (90.7 kg)   BMI 29.53 kg/m   Constitutional:  Well nourished. Alert and oriented, No acute distress. HEENT: [place] AT, moist mucus membranes.  Trachea midline Cardiovascular: No clubbing, cyanosis, or edema. Respiratory: Normal respiratory effort, no increased work of breathing. GU: No CVA tenderness.  No bladder fullness or masses.  Patient with circumcised phallus.  Urethral meatus is patent.  No penile discharge. No penile lesions or rashes.  Incision is clean and dry and well-healed.  There is some edema in the ventral portion of the frenulum area of the penis that is reducible when pressure is applied.  Scrotum without lesions, cysts, rashes and/or  edema.  Neurologic: Grossly intact, no focal deficits, moving all 4 extremities. Psychiatric: Normal mood and affect.   Laboratory Data: N/A  Pertinent Imaging N/A  Assessment & Plan:    Penile hematoma  - Exam was reassuring   -Explained that the surgical part has well-healed and there is no sign of infection and that the swelling he is experiencing is the lymphatic material that remained after the healing process.  At this time I have recommended to wrap his penis with Kerlix to provide light compression and to wear underwear/boxers that are 2 sizes smaller to keep the penis in a upright position to allow the fluid to return to central circulation.  I also encouraged him to start exercising and take a daily aspirin for the dorsal vein pain.  Return in about 2 weeks (around 12/29/2021) for recheck .  12/31/2021, PA-C   [facility] [address] [place], [place] [phone]

## 2021-12-15 ENCOUNTER — Ambulatory Visit (INDEPENDENT_AMBULATORY_CARE_PROVIDER_SITE_OTHER): Payer: BLUE CROSS/BLUE SHIELD | Admitting: Urology

## 2021-12-15 ENCOUNTER — Encounter: Payer: Self-pay | Admitting: Urology

## 2021-12-15 VITALS — BP 164/106 | HR 80 | Ht 69.0 in | Wt 200.0 lb

## 2021-12-15 DIAGNOSIS — N4889 Other specified disorders of penis: Secondary | ICD-10-CM

## 2021-12-29 ENCOUNTER — Ambulatory Visit: Payer: BLUE CROSS/BLUE SHIELD | Admitting: Urology
